# Patient Record
Sex: Male | Born: 1935 | Race: Black or African American | Hispanic: No
Health system: Southern US, Community
[De-identification: ages and names within clinical notes are randomized; demographics above are authoritative.]

---

## 2013-08-11 ENCOUNTER — Inpatient Hospital Stay
Admission: AD | Admit: 2013-08-11 | Discharge: 2013-09-14 | Disposition: A | Discharge status: Skilled Nursing Facility | Payer: Self-pay | Source: Ambulatory Visit | Attending: Internal Medicine | Admitting: Internal Medicine

## 2013-08-12 ENCOUNTER — Other Ambulatory Visit (HOSPITAL_COMMUNITY): Payer: Self-pay

## 2013-08-12 LAB — SEDIMENTATION RATE: SED RATE: 78 mm/h — AB (ref 0–16)

## 2013-08-12 LAB — BASIC METABOLIC PANEL
BUN: 9 mg/dL (ref 6–23)
CHLORIDE: 103 meq/L (ref 96–112)
CO2: 25 mEq/L (ref 19–32)
CREATININE: 1.05 mg/dL (ref 0.50–1.35)
Calcium: 8.6 mg/dL (ref 8.4–10.5)
GFR calc Af Amer: 78 mL/min — ABNORMAL LOW (ref >90)
GFR calc non Af Amer: 67 mL/min — ABNORMAL LOW (ref >90)
Glucose, Bld: 144 mg/dL — ABNORMAL HIGH (ref 70–99)
Potassium: 4.1 mEq/L (ref 3.7–5.3)
SODIUM: 140 meq/L (ref 137–147)

## 2013-08-12 LAB — TSH: TSH: 2.336 u[IU]/mL (ref 0.350–4.500)

## 2013-08-12 LAB — CBC
HCT: 33 % — ABNORMAL LOW (ref 39.0–52.0)
Hemoglobin: 10.4 g/dL — ABNORMAL LOW (ref 13.0–17.0)
MCH: 30.1 pg (ref 26.0–34.0)
MCHC: 31.5 g/dL (ref 30.0–36.0)
MCV: 95.4 fL (ref 78.0–100.0)
PLATELETS: 186 10*3/uL (ref 150–400)
RBC: 3.46 MIL/uL — ABNORMAL LOW (ref 4.22–5.81)
RDW: 16.3 % — AB (ref 11.5–15.5)
WBC: 11.8 10*3/uL — AB (ref 4.0–10.5)

## 2013-08-12 LAB — HEPATIC FUNCTION PANEL
ALBUMIN: 2.5 g/dL — AB (ref 3.5–5.2)
ALK PHOS: 70 U/L (ref 39–117)
ALT: 16 U/L (ref 0–53)
AST: 18 U/L (ref 0–37)
Bilirubin, Direct: <0.2 mg/dL (ref 0.0–0.3)
Total Bilirubin: 0.5 mg/dL (ref 0.3–1.2)
Total Protein: 6.1 g/dL (ref 6.0–8.3)

## 2013-08-12 LAB — PROTIME-INR
INR: 2 — ABNORMAL HIGH (ref 0.00–1.49)
Prothrombin Time: 22.1 seconds — ABNORMAL HIGH (ref 11.6–15.2)

## 2013-08-12 LAB — C-REACTIVE PROTEIN: CRP: 4.7 mg/dL — AB (ref <0.60)

## 2013-08-12 LAB — PROCALCITONIN: Procalcitonin: <0.1 ng/mL

## 2013-08-13 LAB — PROTIME-INR
INR: 2.47 — ABNORMAL HIGH (ref 0.00–1.49)
PROTHROMBIN TIME: 25.9 s — AB (ref 11.6–15.2)

## 2013-08-14 ENCOUNTER — Other Ambulatory Visit (HOSPITAL_COMMUNITY): Payer: Self-pay

## 2013-08-14 LAB — CBC WITH DIFFERENTIAL/PLATELET
BASOS PCT: 1 % (ref 0–1)
Basophils Absolute: 0 10*3/uL (ref 0.0–0.1)
EOS ABS: 0.2 10*3/uL (ref 0.0–0.7)
EOS PCT: 2 % (ref 0–5)
HCT: 34 % — ABNORMAL LOW (ref 39.0–52.0)
Hemoglobin: 10.7 g/dL — ABNORMAL LOW (ref 13.0–17.0)
Lymphocytes Relative: 25 % (ref 12–46)
Lymphs Abs: 2 10*3/uL (ref 0.7–4.0)
MCH: 30.4 pg (ref 26.0–34.0)
MCHC: 31.5 g/dL (ref 30.0–36.0)
MCV: 96.6 fL (ref 78.0–100.0)
MONOS PCT: 14 % — AB (ref 3–12)
Monocytes Absolute: 1.1 10*3/uL — ABNORMAL HIGH (ref 0.1–1.0)
NEUTROS PCT: 58 % (ref 43–77)
Neutro Abs: 4.7 10*3/uL (ref 1.7–7.7)
Platelets: 191 10*3/uL (ref 150–400)
RBC: 3.52 MIL/uL — ABNORMAL LOW (ref 4.22–5.81)
RDW: 16.2 % — ABNORMAL HIGH (ref 11.5–15.5)
WBC: 8 10*3/uL (ref 4.0–10.5)

## 2013-08-14 LAB — BASIC METABOLIC PANEL
BUN: 7 mg/dL (ref 6–23)
CALCIUM: 9.2 mg/dL (ref 8.4–10.5)
CO2: 24 mEq/L (ref 19–32)
CREATININE: 0.97 mg/dL (ref 0.50–1.35)
Chloride: 103 mEq/L (ref 96–112)
GFR calc non Af Amer: 78 mL/min — ABNORMAL LOW (ref >90)
GLUCOSE: 92 mg/dL (ref 70–99)
POTASSIUM: 4.3 meq/L (ref 3.7–5.3)
Sodium: 139 mEq/L (ref 137–147)

## 2013-08-14 LAB — CK TOTAL AND CKMB (NOT AT ARMC)
CK TOTAL: 41 U/L (ref 7–232)
CK, MB: 2.2 ng/mL (ref 0.3–4.0)
Relative Index: INVALID (ref 0.0–2.5)

## 2013-08-14 LAB — PROTIME-INR
INR: 2.89 — ABNORMAL HIGH (ref 0.00–1.49)
PROTHROMBIN TIME: 29.2 s — AB (ref 11.6–15.2)

## 2013-08-14 LAB — TROPONIN I: Troponin I: <0.3 ng/mL (ref <0.30)

## 2013-08-14 LAB — PRO B NATRIURETIC PEPTIDE: Pro B Natriuretic peptide (BNP): 2430 pg/mL — ABNORMAL HIGH (ref 0–450)

## 2013-08-14 NOTE — Consult Note (Signed)
Reason for Consult: Gangrenous ulcer lateral aspect right foot status post fourth and fifth ray amputation. Referring Physician: [name] is an 79 y.o. male.  HPI: Patient is a 79 year old gentleman with severe peripheral vascular disease. He has a left above-the-knee amputation. Patient is status post fourth and fifth ray amputations on the right foot. He is developed progressive dehiscence and necrosis of the wound.  No past medical history on file.  No past surgical history on file.  No family history on file.  Social History:  has no tobacco, alcohol, and drug history on file.  Allergies: Allergies not on file  Medications: I have reviewed the patient's current medications.  Results for orders placed during the hospital encounter of 08/11/13 (from the past 48 hour(s))  PROTIME-INR     Status: Abnormal   Collection Time    08/13/13  7:00 AM      Result Value Ref Range   Prothrombin Time 25.9 (*) 11.6 - 15.2 seconds   INR 2.47 (*) 0.00 - 1.49  CBC WITH DIFFERENTIAL     Status: Abnormal   Collection Time    08/14/13  5:25 AM      Result Value Ref Range   WBC 8.0  4.0 - 10.5 K/uL   RBC 3.52 (*) 4.22 - 5.81 MIL/uL   Hemoglobin 10.7 (*) 13.0 - 17.0 g/dL   HCT 34.0 (*) 39.0 - 52.0 %   MCV 96.6  78.0 - 100.0 fL   MCH 30.4  26.0 - 34.0 pg   MCHC 31.5  30.0 - 36.0 g/dL   RDW 16.2 (*) 11.5 - 15.5 %   Platelets 191  150 - 400 K/uL   Neutrophils Relative % 58  43 - 77 %   Neutro Abs 4.7  1.7 - 7.7 K/uL   Lymphocytes Relative 25  12 - 46 %   Lymphs Abs 2.0  0.7 - 4.0 K/uL   Monocytes Relative 14 (*) 3 - 12 %   Monocytes Absolute 1.1 (*) 0.1 - 1.0 K/uL   Eosinophils Relative 2  0 - 5 %   Eosinophils Absolute 0.2  0.0 - 0.7 K/uL   Basophils Relative 1  0 - 1 %   Basophils Absolute 0.0  0.0 - 0.1 K/uL  BASIC METABOLIC PANEL     Status: Abnormal   Collection Time    08/14/13  5:25 AM      Result Value Ref Range   Sodium 139  137 - 147 mEq/L   Potassium 4.3   3.7 - 5.3 mEq/L   Chloride 103  96 - 112 mEq/L   CO2 24  19 - 32 mEq/L   Glucose, Bld 92  70 - 99 mg/dL   BUN 7  6 - 23 mg/dL   Creatinine, Ser 0.97  0.50 - 1.35 mg/dL   Calcium 9.2  8.4 - 10.5 mg/dL   GFR calc non Af Amer 78 (*) >90 mL/min   GFR calc Af Amer >90  >90 mL/min   Comment: (NOTE)     The eGFR has been calculated using the CKD EPI equation.     This calculation has not been validated in all clinical situations.     eGFR's persistently <90 mL/min signify possible Chronic Kidney     Disease.  PRO B NATRIURETIC PEPTIDE     Status: Abnormal   Collection Time    08/14/13  5:25 AM      Result Value Ref Range   Pro  B Natriuretic peptide (BNP) 2430.0 (*) 0 - 450 pg/mL  TROPONIN I     Status: None   Collection Time    08/14/13  5:25 AM      Result Value Ref Range   Troponin I <0.30  <0.30 ng/mL   Comment:            Due to the release kinetics of cTnI,     a negative result within the first hours     of the onset of symptoms does not rule out     myocardial infarction with certainty.     If myocardial infarction is still suspected,     repeat the test at appropriate intervals.  CK TOTAL AND CKMB     Status: None   Collection Time    08/14/13  5:25 AM      Result Value Ref Range   Total CK 41  7 - 232 U/L   CK, MB 2.2  0.3 - 4.0 ng/mL   Relative Index RELATIVE INDEX IS INVALID  0.0 - 2.5   Comment: WHEN CK < 100 U/L             PROTIME-INR     Status: Abnormal   Collection Time    08/14/13  5:25 AM      Result Value Ref Range   Prothrombin Time 29.2 (*) 11.6 - 15.2 seconds   INR 2.89 (*) 0.00 - 1.49    Dg Foot Complete Right  08/14/2013   CLINICAL DATA:  Transmetatarsal amputation of the fourth and fifth toes  EXAM: RIGHT FOOT COMPLETE - 3+ VIEW  COMPARISON:  No preoperative imaging  FINDINGS: Evidence of transmetatarsal amputation at the distal fourth and fifth toes is noted. Degenerative change noted at the first metatarsal-phalangeal joint. No radiopaque foreign  body. No other soft tissue abnormality except for soft tissue defect.  IMPRESSION: Postsurgical changes as above status post transmetatarsal amputation of the right fourth and fifth metatarsals.   Electronically Signed   By: Conchita Paris M.D.   On: 08/14/2013 17:00    Review of Systems  All other systems reviewed and are negative.   There were no vitals taken for this visit. Physical Exam On examination patient has thin atrophic skin to the right lower extremity. He is extremely tender to light touch throughout the lower extremity. Patient has a necrotic wound over the fourth and fifth ray amputation sites which is 2 cm x 3 cm x 1 cm deep. There is 50% granulation tissue but the wound is ischemic. There is no exposed bone. Assessment/Plan: Assessment: Ischemic ulcer lateral aspect right foot status post fourth and fifth ray amputations.  Plan: Radiographs and ankle-brachial indices have been ordered. I anticipate ankle-brachial indices should show insufficient circulation for healing. Patient would require a vascular surgery consult to see if he has a revascularization candidate. If patient is not a revascularization candidate a above-the-knee amputation is his best option.  Daniel,Steven V 08/14/2013, 6:39 PM

## 2013-08-15 DIAGNOSIS — I70269 Atherosclerosis of native arteries of extremities with gangrene, unspecified extremity: Secondary | ICD-10-CM

## 2013-08-15 LAB — PROTIME-INR
INR: 2.78 — AB (ref 0.00–1.49)
PROTHROMBIN TIME: 28.4 s — AB (ref 11.6–15.2)

## 2013-08-15 LAB — VANCOMYCIN, TROUGH: VANCOMYCIN TR: 13.3 ug/mL (ref 10.0–20.0)

## 2013-08-15 NOTE — Consult Note (Signed)
[facility] NOTE   MRN : [id number]  Reason for Consult: Gangrenous ulcer lateral aspect right foot status post fourth and fifth ray amputation.  Referring Physician: Merril Abbe  History of Present Illness: 79 y/o male with gangrenous ulcer lateral aspect right foot status post fourth and fifth ray amputation.  H had a left AKA 10 years ago secondary to "blood clot."  His medical history includes COPD, HTN, history of DVT on Coumadin, Asprin 81 mg, and Carvedilol with Amiodarone.  They are currently treating him with Vancomycin for his wound.  The history states he has known PAD.  He had an arteriogram with popliteal stent in Feb. 2015 in [facility] by Dr Sondra Come.          Pt meds include: Statin :No Betablocker: Yes ASA: Yes Other anticoagulants/antiplatelets: Coumadin   Past medical history includes: Cardiomyopathy DVT COPD Home O2 DM  Social History History  Substance Use Topics  . Smoking status: Not on file  . Smokeless tobacco: Not on file  . Alcohol Use: Not on file    Family History The patient states he does not know his family history   NKDA  REVIEW OF SYSTEMS  General: [ ]  Weight loss, [ ]  Fever, [ ]  chills Neurologic: [ ]  Dizziness, [ ]  Blackouts, [ ]  Seizure [ ]  Stroke, [ ]  "Mini stroke", [ ]  Slurred speech, [ ]  Temporary blindness; [ ]  weakness in arms or legs, [ ]  Hoarseness [ ]  Dysphagia Cardiac: [ ]  Chest pain/pressure, [ ]  Shortness of breath at rest [ ]  Shortness of breath with exertion, [ ]  Atrial fibrillation or irregular heartbeat  Vascular: [x ] Pain in legs with walking, [ ]  Pain in legs at rest, [ ]  Pain in legs at night,  [ ]  Non-healing ulcer, [ ]  Blood clot in vein/DVT,   Pulmonary: [x ] Home oxygen, [ ]  Productive cough, [ ]  Coughing up blood, [ ]  Asthma,  [ ]  Wheezing [x ] COPD Musculoskeletal:  [ ]  Arthritis, [ ]  Low back pain, [ ]  Joint pain Hematologic: [ ]  Easy Bruising, [ ]  Anemia; [ ]   Hepatitis Gastrointestinal: [ ]  Blood in stool, [ ]  Gastroesophageal Reflux/heartburn, Urinary: [ ]  chronic Kidney disease, [ ]  on HD - [ ]  MWF or [ ]  TTHS, [ ]  Burning with urination, [ ]  Difficulty urinating Skin: [ ]  Rashes, [x ] Wounds Psychological: [ ]  Anxiety, [ ]  Depression  Physical Examination  General:  WDWN in NAD HENT: WNL Eyes: Pupils equal Pulmonary: normal non-labored breathing Cardiac: RRR, without  Murmurs, rubs or gallops; Abdomen: soft, NT, no masses Skin: no rashes, ulcers noted;  positive Gangrene , no cellulitis; positive open wound right lateral foot post 4th and 5th digit amputation. No drainage.  Well healed AKA.  Vascular Exam/Pulses:weak palpable femoral pulses, right LE non palpable pulses.  Doppler signal anterior tibialis only.   Musculoskeletal: no muscle wasting or atrophy; no edema  Neurologic: A&O X 3; Appropriate Affect ;  SENSATION: normal; MOTOR FUNCTION: 5/5 Symmetric Speech is fluent/normal   Significant Diagnostic Studies: CBC Lab Results  Component Value Date   WBC 8.0 08/14/2013   HGB 10.7* 08/14/2013   HCT 34.0* 08/14/2013   MCV 96.6 08/14/2013   PLT 191 08/14/2013    BMET    Component Value Date/Time   NA 139 08/14/2013 0525   K 4.3 08/14/2013 0525   CL 103 08/14/2013 0525   CO2 24 08/14/2013 0525  GLUCOSE 92 08/14/2013 0525   BUN 7 08/14/2013 0525   CREATININE 0.97 08/14/2013 0525   CALCIUM 9.2 08/14/2013 0525   GFRNONAA 78* 08/14/2013 0525   GFRAA >90 08/14/2013 0525   CrCl is unknown because there is no height on file for the current visit.  COAG Lab Results  Component Value Date   INR 2.78* 08/15/2013   INR 2.89* 08/14/2013   INR 2.47* 08/13/2013     Non-Invasive Vascular Imaging: Pending ABI,   ASSESSMENT/PLAN:  PAD Right lateral non healing wound s/p 4th and 5th digit amputations. We will need a copy of the arterial study and or OP note from stent. He may need an angiogram for further workup. I will discuss this  with Dr. Ivar DrapeFields   COLLINS, EMMA [facility] 08/15/2013 3:40 PM  History and exam details as above.  Pt is non ambulatory and has not walked for several months.  He has either some dementia or encephalopathy.  Most likely his popliteal stent is occluded based on absent popliteal pulse and the poorly healing wound on his foot. He does have a femoral pulse.  He is not a candidate for open bypass procedure and in his current state of health I do not believe he is a candidate for repeat arteriogram or intervention.  I discussed amputation of the right leg of the patient and he is amenable to this.  No vascular intervention planned at this point.  Agree with Dr Lajoyce Cornersuda that right AKA is probably best plan for infection contral and wound healing.  Fabienne Brunsharles Fields, MD [facility] [place] Office: [phone] Pager: [phone]

## 2013-08-16 LAB — CBC WITH DIFFERENTIAL/PLATELET
BASOS ABS: 0 10*3/uL (ref 0.0–0.1)
BASOS PCT: 0 % (ref 0–1)
Eosinophils Absolute: 0.2 10*3/uL (ref 0.0–0.7)
Eosinophils Relative: 2 % (ref 0–5)
HEMATOCRIT: 34.8 % — AB (ref 39.0–52.0)
Hemoglobin: 11.1 g/dL — ABNORMAL LOW (ref 13.0–17.0)
LYMPHS PCT: 20 % (ref 12–46)
Lymphs Abs: 1.9 10*3/uL (ref 0.7–4.0)
MCH: 30.6 pg (ref 26.0–34.0)
MCHC: 31.9 g/dL (ref 30.0–36.0)
MCV: 95.9 fL (ref 78.0–100.0)
Monocytes Absolute: 1.7 10*3/uL — ABNORMAL HIGH (ref 0.1–1.0)
Monocytes Relative: 18 % — ABNORMAL HIGH (ref 3–12)
NEUTROS ABS: 5.8 10*3/uL (ref 1.7–7.7)
Neutrophils Relative %: 61 % (ref 43–77)
PLATELETS: 156 10*3/uL (ref 150–400)
RBC: 3.63 MIL/uL — ABNORMAL LOW (ref 4.22–5.81)
RDW: 15.8 % — ABNORMAL HIGH (ref 11.5–15.5)
WBC: 9.5 10*3/uL (ref 4.0–10.5)

## 2013-08-16 LAB — PROTIME-INR
INR: 2.53 — AB (ref 0.00–1.49)
PROTHROMBIN TIME: 26.4 s — AB (ref 11.6–15.2)

## 2013-08-16 LAB — BASIC METABOLIC PANEL
BUN: 11 mg/dL (ref 6–23)
CALCIUM: 9.2 mg/dL (ref 8.4–10.5)
CHLORIDE: 102 meq/L (ref 96–112)
CO2: 25 meq/L (ref 19–32)
Creatinine, Ser: 1.03 mg/dL (ref 0.50–1.35)
GFR calc Af Amer: 79 mL/min — ABNORMAL LOW (ref >90)
GFR calc non Af Amer: 68 mL/min — ABNORMAL LOW (ref >90)
Glucose, Bld: 114 mg/dL — ABNORMAL HIGH (ref 70–99)
Potassium: 4.5 mEq/L (ref 3.7–5.3)
Sodium: 140 mEq/L (ref 137–147)

## 2013-08-17 ENCOUNTER — Other Ambulatory Visit (HOSPITAL_COMMUNITY): Payer: Self-pay | Admitting: Orthopedic Surgery

## 2013-08-17 LAB — VANCOMYCIN, TROUGH: Vancomycin Tr: 11.7 ug/mL (ref 10.0–20.0)

## 2013-08-17 LAB — PROTIME-INR
INR: 2.64 — ABNORMAL HIGH (ref 0.00–1.49)
PROTHROMBIN TIME: 27.3 s — AB (ref 11.6–15.2)

## 2013-08-18 ENCOUNTER — Encounter (HOSPITAL_COMMUNITY): Payer: Self-pay | Admitting: Anesthesiology

## 2013-08-18 ENCOUNTER — Encounter
Admission: AD | Disposition: A | Discharge status: Skilled Nursing Facility | Payer: Self-pay | Source: Ambulatory Visit | Attending: Internal Medicine

## 2013-08-18 HISTORY — PX: AMPUTATION: SHX166

## 2013-08-18 LAB — GLUCOSE, CAPILLARY
Glucose-Capillary: 87 mg/dL (ref 70–99)
Glucose-Capillary: 96 mg/dL (ref 70–99)

## 2013-08-18 LAB — BASIC METABOLIC PANEL
BUN: 14 mg/dL (ref 6–23)
CALCIUM: 9.6 mg/dL (ref 8.4–10.5)
CO2: 25 mEq/L (ref 19–32)
Chloride: 101 mEq/L (ref 96–112)
Creatinine, Ser: 1.07 mg/dL (ref 0.50–1.35)
GFR calc Af Amer: 76 mL/min — ABNORMAL LOW (ref >90)
GFR calc non Af Amer: 65 mL/min — ABNORMAL LOW (ref >90)
GLUCOSE: 91 mg/dL (ref 70–99)
Potassium: 4.6 mEq/L (ref 3.7–5.3)
Sodium: 139 mEq/L (ref 137–147)

## 2013-08-18 LAB — CBC
HEMATOCRIT: 35.8 % — AB (ref 39.0–52.0)
HEMOGLOBIN: 11.2 g/dL — AB (ref 13.0–17.0)
MCH: 30.2 pg (ref 26.0–34.0)
MCHC: 31.3 g/dL (ref 30.0–36.0)
MCV: 96.5 fL (ref 78.0–100.0)
Platelets: 156 10*3/uL (ref 150–400)
RBC: 3.71 MIL/uL — ABNORMAL LOW (ref 4.22–5.81)
RDW: 15.6 % — ABNORMAL HIGH (ref 11.5–15.5)
WBC: 8.4 10*3/uL (ref 4.0–10.5)

## 2013-08-18 LAB — PROTIME-INR
INR: 2.14 — ABNORMAL HIGH (ref 0.00–1.49)
Prothrombin Time: 23.2 seconds — ABNORMAL HIGH (ref 11.6–15.2)

## 2013-08-18 SURGERY — AMPUTATION, ABOVE KNEE
Anesthesia: General | Site: Leg Lower | Laterality: Right

## 2013-08-18 MED ORDER — PROPOFOL 10 MG/ML IV BOLUS
INTRAVENOUS | Status: AC
  Filled 2013-08-18: qty 20

## 2013-08-18 MED ORDER — 0.9 % SODIUM CHLORIDE (POUR BTL) OPTIME
TOPICAL | Status: DC | PRN
  Administered 2013-08-18: 1000 mL

## 2013-08-18 MED ORDER — FENTANYL CITRATE 0.05 MG/ML IJ SOLN
INTRAMUSCULAR | Status: DC | PRN
  Administered 2013-08-18 (×3): 50 ug via INTRAVENOUS

## 2013-08-18 MED ORDER — LACTATED RINGERS IV SOLN
INTRAVENOUS | Status: DC | PRN
  Administered 2013-08-18: 13:00:00 via INTRAVENOUS

## 2013-08-18 MED ORDER — FENTANYL CITRATE 0.05 MG/ML IJ SOLN
INTRAMUSCULAR | Status: AC
  Filled 2013-08-18: qty 5

## 2013-08-18 MED ORDER — LIDOCAINE HCL (CARDIAC) 20 MG/ML IV SOLN
INTRAVENOUS | Status: DC | PRN
  Administered 2013-08-18: 80 mg via INTRAVENOUS

## 2013-08-18 MED ORDER — OXYCODONE HCL 5 MG PO TABS: 5.0000 mg | ORAL_TABLET | Freq: Once | ORAL | Status: DC | PRN

## 2013-08-18 MED ORDER — LIDOCAINE HCL (CARDIAC) 20 MG/ML IV SOLN
INTRAVENOUS | Status: AC
  Filled 2013-08-18: qty 5

## 2013-08-18 MED ORDER — OXYCODONE HCL 5 MG/5ML PO SOLN: 5.0000 mg | Freq: Once | ORAL | Status: DC | PRN

## 2013-08-18 MED ORDER — ONDANSETRON HCL 4 MG/2ML IJ SOLN
INTRAMUSCULAR | Status: DC | PRN
  Administered 2013-08-18: 4 mg via INTRAVENOUS

## 2013-08-18 MED ORDER — HYDROMORPHONE HCL PF 1 MG/ML IJ SOLN
INTRAMUSCULAR | Status: AC
  Filled 2013-08-18: qty 1

## 2013-08-18 MED ORDER — CEFAZOLIN SODIUM-DEXTROSE 2-3 GM-% IV SOLR
INTRAVENOUS | Status: DC | PRN
  Administered 2013-08-18: 2 g via INTRAVENOUS

## 2013-08-18 MED ORDER — LACTATED RINGERS IV SOLN
INTRAVENOUS | Status: DC
  Administered 2013-08-18: 13:00:00 via INTRAVENOUS

## 2013-08-18 MED ORDER — MIDAZOLAM HCL 2 MG/2ML IJ SOLN
INTRAMUSCULAR | Status: AC
  Filled 2013-08-18: qty 2

## 2013-08-18 MED ORDER — PROPOFOL 10 MG/ML IV BOLUS
INTRAVENOUS | Status: DC | PRN
  Administered 2013-08-18: 160 mg via INTRAVENOUS

## 2013-08-18 MED ORDER — HYDROMORPHONE HCL PF 1 MG/ML IJ SOLN
0.2500 mg | INTRAMUSCULAR | Status: DC | PRN
  Administered 2013-08-18: 0.25 mg via INTRAVENOUS

## 2013-08-18 MED ORDER — METOCLOPRAMIDE HCL 5 MG/ML IJ SOLN: 10.0000 mg | Freq: Once | INTRAMUSCULAR | Status: DC | PRN

## 2013-08-18 SURGICAL SUPPLY — 40 items
BANDAGE GAUZE ELAST BULKY 4 IN (GAUZE/BANDAGES/DRESSINGS) ×3 IMPLANT
BLADE SAW RECIP 87.9 MT (BLADE) ×3 IMPLANT
BNDG COHESIVE 6X5 TAN STRL LF (GAUZE/BANDAGES/DRESSINGS) ×3 IMPLANT
COVER SURGICAL LIGHT HANDLE (MISCELLANEOUS) ×3 IMPLANT
CUFF TOURNIQUET SINGLE 34IN LL (TOURNIQUET CUFF) IMPLANT
CUFF TOURNIQUET SINGLE 44IN (TOURNIQUET CUFF) IMPLANT
DRAIN PENROSE 1/2X12 LTX STRL (WOUND CARE) IMPLANT
DRAPE EXTREMITY T 121X128X90 (DRAPE) ×3 IMPLANT
DRAPE PROXIMA HALF (DRAPES) ×3 IMPLANT
DRAPE U-SHAPE 47X51 STRL (DRAPES) ×6 IMPLANT
DRSG ADAPTIC 3X8 NADH LF (GAUZE/BANDAGES/DRESSINGS) ×3 IMPLANT
DURAPREP 26ML APPLICATOR (WOUND CARE) ×3 IMPLANT
ELECT CAUTERY BLADE 6.4 (BLADE) IMPLANT
ELECT REM PT RETURN 9FT ADLT (ELECTROSURGICAL) ×3
ELECTRODE REM PT RTRN 9FT ADLT (ELECTROSURGICAL) ×1 IMPLANT
EVACUATOR 1/8 PVC DRAIN (DRAIN) IMPLANT
GLOVE BIOGEL PI IND STRL 9 (GLOVE) ×1 IMPLANT
GLOVE BIOGEL PI INDICATOR 9 (GLOVE) ×2
GLOVE SURG ORTHO 9.0 STRL STRW (GLOVE) ×3 IMPLANT
GOWN STRL REUS W/ TWL XL LVL3 (GOWN DISPOSABLE) ×2 IMPLANT
GOWN STRL REUS W/TWL XL LVL3 (GOWN DISPOSABLE) ×4
KIT BASIN OR (CUSTOM PROCEDURE TRAY) ×3 IMPLANT
KIT ROOM TURNOVER OR (KITS) ×3 IMPLANT
MANIFOLD NEPTUNE II (INSTRUMENTS) ×3 IMPLANT
NS IRRIG 1000ML POUR BTL (IV SOLUTION) ×3 IMPLANT
PACK GENERAL/GYN (CUSTOM PROCEDURE TRAY) ×3 IMPLANT
PAD ABD 8X10 STRL (GAUZE/BANDAGES/DRESSINGS) ×6 IMPLANT
PAD ARMBOARD 7.5X6 YLW CONV (MISCELLANEOUS) ×3 IMPLANT
SPONGE GAUZE 4X4 12PLY (GAUZE/BANDAGES/DRESSINGS) ×3 IMPLANT
STAPLER VISISTAT 35W (STAPLE) IMPLANT
STOCKINETTE IMPERVIOUS LG (DRAPES) IMPLANT
SUT PDS AB 1 CT  36 (SUTURE)
SUT PDS AB 1 CT 36 (SUTURE) IMPLANT
SUT SILK 2 0 (SUTURE) ×2
SUT SILK 2-0 18XBRD TIE 12 (SUTURE) ×1 IMPLANT
SWAB COLLECTION DEVICE MRSA (MISCELLANEOUS) IMPLANT
TOWEL OR 17X24 6PK STRL BLUE (TOWEL DISPOSABLE) ×3 IMPLANT
TOWEL OR 17X26 10 PK STRL BLUE (TOWEL DISPOSABLE) ×3 IMPLANT
TUBE ANAEROBIC SPECIMEN COL (MISCELLANEOUS) IMPLANT
WATER STERILE IRR 1000ML POUR (IV SOLUTION) ×3 IMPLANT

## 2013-08-18 NOTE — Interval H&P Note (Signed)
History and Physical Interval Note:  08/18/2013 6:08 AM  [name]  has presented today for surgery, with the diagnosis of Gangrene Right Foot  The various methods of treatment have been discussed with the patient and family. After consideration of risks, benefits and other options for treatment, the patient has consented to  Procedure(s) with comments: AMPUTATION ABOVE KNEE (Right) - Right Above Knee Amputation as a surgical intervention .  The patient's history has been reviewed, patient examined, no change in status, stable for surgery.  I have reviewed the patient's chart and labs.  Questions were answered to the patient's satisfaction.     Hilmar Moldovan V

## 2013-08-18 NOTE — Anesthesia Preprocedure Evaluation (Signed)
Anesthesia Evaluation  Patient identified by MRN, date of birth, ID band Patient awake    Reviewed: Allergy & Precautions, H&P , NPO status , Patient's Chart, lab work & pertinent test results, reviewed documented beta blocker date and time   Airway Mallampati: II TM Distance: >3 FB Neck ROM: full    Dental   Pulmonary neg pulmonary ROS,  breath sounds clear to auscultation        Cardiovascular hypertension, On Medications + Peripheral Vascular Disease and +CHF Rhythm:regular     Neuro/Psych negative neurological ROS  negative psych ROS   GI/Hepatic negative GI ROS, Neg liver ROS,   Endo/Other  diabetes  Renal/GU negative Renal ROS  negative genitourinary   Musculoskeletal   Abdominal   Peds  Hematology negative hematology ROS (+)   Anesthesia Other Findings See surgeon's H&P   Reproductive/Obstetrics negative OB ROS                           Anesthesia Physical Anesthesia Plan  ASA: III  Anesthesia Plan: General   Post-op Pain Management:    Induction: Intravenous  Airway Management Planned: LMA  Additional Equipment:   Intra-op Plan:   Post-operative Plan:   Informed Consent: I have reviewed the patients History and Physical, chart, labs and discussed the procedure including the risks, benefits and alternatives for the proposed anesthesia with the patient or authorized representative who has indicated his/her understanding and acceptance.   Dental Advisory Given  Plan Discussed with: CRNA and Surgeon  Anesthesia Plan Comments:         Anesthesia Quick Evaluation

## 2013-08-18 NOTE — Preoperative (Signed)
Beta Blockers   Reason not to administer Beta Blockers:Not Applicable 

## 2013-08-18 NOTE — Op Note (Signed)
OPERATIVE REPORT  DATE OF SURGERY: 08/18/2013  PATIENT:  Steven BreadJohn Lobosco,  79 y.o. male  PRE-OPERATIVE DIAGNOSIS:  Gangrene Right Foot  POST-OPERATIVE DIAGNOSIS:  Gangrene Right Foot  PROCEDURE:  Procedure(s): AMPUTATION ABOVE KNEE  SURGEON:  Surgeon(s): Nadara MustardMarcus V [name], MD  ANESTHESIA:   general  EBL:  Minimal ML  SPECIMEN:  Source of Specimen:  Right leg  TOURNIQUET:  * No tourniquets in log *  PROCEDURE DETAILS: Patient is a 79 year old gentleman with severe peripheral vascular disease. He is status post revascularization to the right lower extremity consult with vascular specialist show there was no circulation distal to the popliteal fossa. Patient is not revascularization candidate and presents at this time for above-the-knee amputation. Risks and benefits were discussed including nonhealing of the wound and need for higher level amputation. Patient states he understands and wished to proceed at this time. Description of procedure patient was brought to the operating room underwent a general anesthetic. After adequate levels of anesthesia were obtained patient's right lower extremity was prepped using DuraPrep and draped into a sterile field. A fishmouth incision was made just proximal to the patella. The femur was transected just proximal to the skin incision. The wound is irrigated with normal saline the deep and superficial fascial layers were closed using #1 PDS. The skin was closed using staples. The wound is covered Adaptic orthopedic sponges AB dressing Kerlix and Coban. Patient was extubated taken to the PACU in stable condition [facility]  [facility] CARE[facility]  PATIENT DISPOSITION:  PACU - hemodynamically stable.   Nadara MustardUDA,MARCUS V, MD 08/18/2013 4:30 PM

## 2013-08-18 NOTE — H&P (View-Only) (Signed)
Reason for Consult: Gangrenous ulcer lateral aspect right foot status post fourth and fifth ray amputation. Referring Physician: Dr [name] is an 79 y.o. male.  HPI: Patient is a 79 year old gentleman with severe peripheral vascular disease. He has a left above-the-knee amputation. Patient is status post fourth and fifth ray amputations on the right foot. He is developed progressive dehiscence and necrosis of the wound.  No past medical history on file.  No past surgical history on file.  No family history on file.  Social History:  has no tobacco, alcohol, and drug history on file.  Allergies: Allergies not on file  Medications: I have reviewed the patient's current medications.  Results for orders placed during the hospital encounter of 08/11/13 (from the past 48 hour(s))  PROTIME-INR     Status: Abnormal   Collection Time    08/13/13  7:00 AM      Result Value Ref Range   Prothrombin Time 25.9 (*) 11.6 - 15.2 seconds   INR 2.47 (*) 0.00 - 1.49  CBC WITH DIFFERENTIAL     Status: Abnormal   Collection Time    08/14/13  5:25 AM      Result Value Ref Range   WBC 8.0  4.0 - 10.5 K/uL   RBC 3.52 (*) 4.22 - 5.81 MIL/uL   Hemoglobin 10.7 (*) 13.0 - 17.0 g/dL   HCT 34.0 (*) 39.0 - 52.0 %   MCV 96.6  78.0 - 100.0 fL   MCH 30.4  26.0 - 34.0 pg   MCHC 31.5  30.0 - 36.0 g/dL   RDW 16.2 (*) 11.5 - 15.5 %   Platelets 191  150 - 400 K/uL   Neutrophils Relative % 58  43 - 77 %   Neutro Abs 4.7  1.7 - 7.7 K/uL   Lymphocytes Relative 25  12 - 46 %   Lymphs Abs 2.0  0.7 - 4.0 K/uL   Monocytes Relative 14 (*) 3 - 12 %   Monocytes Absolute 1.1 (*) 0.1 - 1.0 K/uL   Eosinophils Relative 2  0 - 5 %   Eosinophils Absolute 0.2  0.0 - 0.7 K/uL   Basophils Relative 1  0 - 1 %   Basophils Absolute 0.0  0.0 - 0.1 K/uL  BASIC METABOLIC PANEL     Status: Abnormal   Collection Time    08/14/13  5:25 AM      Result Value Ref Range   Sodium 139  137 - 147 mEq/L   Potassium 4.3   3.7 - 5.3 mEq/L   Chloride 103  96 - 112 mEq/L   CO2 24  19 - 32 mEq/L   Glucose, Bld 92  70 - 99 mg/dL   BUN 7  6 - 23 mg/dL   Creatinine, Ser 0.97  0.50 - 1.35 mg/dL   Calcium 9.2  8.4 - 10.5 mg/dL   GFR calc non Af Amer 78 (*) >90 mL/min   GFR calc Af Amer >90  >90 mL/min   Comment: (NOTE)     The eGFR has been calculated using the CKD EPI equation.     This calculation has not been validated in all clinical situations.     eGFR's persistently <90 mL/min signify possible Chronic Kidney     Disease.  PRO B NATRIURETIC PEPTIDE     Status: Abnormal   Collection Time    08/14/13  5:25 AM      Result Value Ref Range   Pro  B Natriuretic peptide (BNP) 2430.0 (*) 0 - 450 pg/mL  TROPONIN I     Status: None   Collection Time    08/14/13  5:25 AM      Result Value Ref Range   Troponin I <0.30  <0.30 ng/mL   Comment:            Due to the release kinetics of cTnI,     a negative result within the first hours     of the onset of symptoms does not rule out     myocardial infarction with certainty.     If myocardial infarction is still suspected,     repeat the test at appropriate intervals.  CK TOTAL AND CKMB     Status: None   Collection Time    08/14/13  5:25 AM      Result Value Ref Range   Total CK 41  7 - 232 U/L   CK, MB 2.2  0.3 - 4.0 ng/mL   Relative Index RELATIVE INDEX IS INVALID  0.0 - 2.5   Comment: WHEN CK < 100 U/L             PROTIME-INR     Status: Abnormal   Collection Time    08/14/13  5:25 AM      Result Value Ref Range   Prothrombin Time 29.2 (*) 11.6 - 15.2 seconds   INR 2.89 (*) 0.00 - 1.49    Dg Foot Complete Right  08/14/2013   CLINICAL DATA:  Transmetatarsal amputation of the fourth and fifth toes  EXAM: RIGHT FOOT COMPLETE - 3+ VIEW  COMPARISON:  No preoperative imaging  FINDINGS: Evidence of transmetatarsal amputation at the distal fourth and fifth toes is noted. Degenerative change noted at the first metatarsal-phalangeal joint. No radiopaque foreign  body. No other soft tissue abnormality except for soft tissue defect.  IMPRESSION: Postsurgical changes as above status post transmetatarsal amputation of the right fourth and fifth metatarsals.   Electronically Signed   By: Conchita Paris M.D.   On: 08/14/2013 17:00    Review of Systems  All other systems reviewed and are negative.   There were no vitals taken for this visit. Physical Exam On examination patient has thin atrophic skin to the right lower extremity. He is extremely tender to light touch throughout the lower extremity. Patient has a necrotic wound over the fourth and fifth ray amputation sites which is 2 cm x 3 cm x 1 cm deep. There is 50% granulation tissue but the wound is ischemic. There is no exposed bone. Assessment/Plan: Assessment: Ischemic ulcer lateral aspect right foot status post fourth and fifth ray amputations.  Plan: Radiographs and ankle-brachial indices have been ordered. I anticipate ankle-brachial indices should show insufficient circulation for healing. Patient would require a vascular surgery consult to see if he has a revascularization candidate. If patient is not a revascularization candidate a above-the-knee amputation is his best option.  [name],[name] 08/14/2013, 6:39 PM

## 2013-08-18 NOTE — Transfer of Care (Signed)
Immediate Anesthesia Transfer of Care Note  Patient: Steven BreadJohn Daniel  Procedure(s) Performed: Procedure(s) with comments: AMPUTATION ABOVE KNEE (Right) - Right Above Knee Amputation  Patient Location: PACU  Anesthesia Type:General  Level of Consciousness: alert , oriented and patient cooperative  Airway & Oxygen Therapy: Patient Spontanous Breathing and Patient connected to face mask oxygen  Post-op Assessment: Report given to PACU RN and Post -op Vital signs reviewed and stable  Post vital signs: Reviewed and stable  Complications: No apparent anesthesia complications

## 2013-08-18 NOTE — Anesthesia Postprocedure Evaluation (Signed)
  Anesthesia Post-op Note  Patient: Steven BreadJohn Daniel  Procedure(s) Performed: Procedure(s) with comments: AMPUTATION ABOVE KNEE (Right) - Right Above Knee Amputation  Patient Location: PACU  Anesthesia Type:General  Level of Consciousness: awake, alert  and oriented  Airway and Oxygen Therapy: Patient Spontanous Breathing  Post-op Pain: mild  Post-op Assessment: Post-op Vital signs reviewed, Patient's Cardiovascular Status Stable, Respiratory Function Stable, Patent Airway, No signs of Nausea or vomiting and Pain level controlled  Post-op Vital Signs: Reviewed and stable  Complications: No apparent anesthesia complications

## 2013-08-19 LAB — CBC WITH DIFFERENTIAL/PLATELET
Basophils Absolute: 0 10*3/uL (ref 0.0–0.1)
Basophils Relative: 0 % (ref 0–1)
Eosinophils Absolute: 0 10*3/uL (ref 0.0–0.7)
Eosinophils Relative: 0 % (ref 0–5)
HEMATOCRIT: 34.2 % — AB (ref 39.0–52.0)
Hemoglobin: 11.4 g/dL — ABNORMAL LOW (ref 13.0–17.0)
LYMPHS PCT: 16 % (ref 12–46)
Lymphs Abs: 2.2 10*3/uL (ref 0.7–4.0)
MCH: 31.5 pg (ref 26.0–34.0)
MCHC: 33.3 g/dL (ref 30.0–36.0)
MCV: 94.5 fL (ref 78.0–100.0)
MONO ABS: 2.5 10*3/uL — AB (ref 0.1–1.0)
Monocytes Relative: 18 % — ABNORMAL HIGH (ref 3–12)
Neutro Abs: 9.3 10*3/uL — ABNORMAL HIGH (ref 1.7–7.7)
Neutrophils Relative %: 66 % (ref 43–77)
Platelets: 203 10*3/uL (ref 150–400)
RBC: 3.62 MIL/uL — AB (ref 4.22–5.81)
RDW: 15.1 % (ref 11.5–15.5)
WBC: 14 10*3/uL — AB (ref 4.0–10.5)

## 2013-08-19 LAB — COMPREHENSIVE METABOLIC PANEL
ALT: 12 U/L (ref 0–53)
AST: 28 U/L (ref 0–37)
Albumin: 2.8 g/dL — ABNORMAL LOW (ref 3.5–5.2)
Alkaline Phosphatase: 74 U/L (ref 39–117)
BUN: 14 mg/dL (ref 6–23)
CO2: 25 meq/L (ref 19–32)
CREATININE: 1.04 mg/dL (ref 0.50–1.35)
Calcium: 9.3 mg/dL (ref 8.4–10.5)
Chloride: 96 mEq/L (ref 96–112)
GFR, EST AFRICAN AMERICAN: 78 mL/min — AB (ref >90)
GFR, EST NON AFRICAN AMERICAN: 68 mL/min — AB (ref >90)
Glucose, Bld: 129 mg/dL — ABNORMAL HIGH (ref 70–99)
Potassium: 4.7 mEq/L (ref 3.7–5.3)
Sodium: 135 mEq/L — ABNORMAL LOW (ref 137–147)
Total Bilirubin: 1.4 mg/dL — ABNORMAL HIGH (ref 0.3–1.2)
Total Protein: 7.4 g/dL (ref 6.0–8.3)

## 2013-08-20 LAB — BLOOD GAS, ARTERIAL
Acid-Base Excess: 2.9 mmol/L — ABNORMAL HIGH (ref 0.0–2.0)
BICARBONATE: 26.6 meq/L — AB (ref 20.0–24.0)
FIO2: 0.21 %
O2 SAT: 97.6 %
PATIENT TEMPERATURE: 98.6
TCO2: 27.8 mmol/L (ref 0–100)
pCO2 arterial: 39 mmHg (ref 35.0–45.0)
pH, Arterial: 7.449 (ref 7.350–7.450)
pO2, Arterial: 90.3 mmHg (ref 80.0–100.0)

## 2013-08-20 LAB — URINALYSIS, ROUTINE W REFLEX MICROSCOPIC
Bilirubin Urine: NEGATIVE
GLUCOSE, UA: NEGATIVE mg/dL
Hgb urine dipstick: NEGATIVE
Ketones, ur: NEGATIVE mg/dL
NITRITE: NEGATIVE
PROTEIN: NEGATIVE mg/dL
Specific Gravity, Urine: 1.024 (ref 1.005–1.030)
UROBILINOGEN UA: 0.2 mg/dL (ref 0.0–1.0)
pH: 5 (ref 5.0–8.0)

## 2013-08-20 LAB — URINE MICROSCOPIC-ADD ON

## 2013-08-20 LAB — PROTIME-INR
INR: 1.69 — AB (ref 0.00–1.49)
Prothrombin Time: 19.4 seconds — ABNORMAL HIGH (ref 11.6–15.2)

## 2013-08-21 ENCOUNTER — Other Ambulatory Visit (HOSPITAL_COMMUNITY): Payer: Self-pay

## 2013-08-21 LAB — CBC WITH DIFFERENTIAL/PLATELET
BASOS ABS: 0 10*3/uL (ref 0.0–0.1)
BASOS PCT: 0 % (ref 0–1)
Eosinophils Absolute: 0.1 10*3/uL (ref 0.0–0.7)
Eosinophils Relative: 1 % (ref 0–5)
HCT: 34 % — ABNORMAL LOW (ref 39.0–52.0)
Hemoglobin: 10.9 g/dL — ABNORMAL LOW (ref 13.0–17.0)
Lymphocytes Relative: 16 % (ref 12–46)
Lymphs Abs: 2.1 10*3/uL (ref 0.7–4.0)
MCH: 30.5 pg (ref 26.0–34.0)
MCHC: 32.1 g/dL (ref 30.0–36.0)
MCV: 95.2 fL (ref 78.0–100.0)
Monocytes Absolute: 2.1 10*3/uL — ABNORMAL HIGH (ref 0.1–1.0)
Monocytes Relative: 16 % — ABNORMAL HIGH (ref 3–12)
NEUTROS PCT: 67 % (ref 43–77)
Neutro Abs: 8.6 10*3/uL — ABNORMAL HIGH (ref 1.7–7.7)
PLATELETS: 212 10*3/uL (ref 150–400)
RBC: 3.57 MIL/uL — AB (ref 4.22–5.81)
RDW: 14.8 % (ref 11.5–15.5)
WBC: 13 10*3/uL — ABNORMAL HIGH (ref 4.0–10.5)

## 2013-08-21 LAB — URINE CULTURE
Colony Count: NO GROWTH
Culture: NO GROWTH

## 2013-08-21 LAB — COMPREHENSIVE METABOLIC PANEL
ALBUMIN: 2.7 g/dL — AB (ref 3.5–5.2)
ALK PHOS: 72 U/L (ref 39–117)
ALT: 11 U/L (ref 0–53)
AST: 21 U/L (ref 0–37)
BUN: 20 mg/dL (ref 6–23)
CO2: 28 mEq/L (ref 19–32)
Calcium: 9.2 mg/dL (ref 8.4–10.5)
Chloride: 96 mEq/L (ref 96–112)
Creatinine, Ser: 1.1 mg/dL (ref 0.50–1.35)
GFR calc Af Amer: 73 mL/min — ABNORMAL LOW (ref >90)
GFR calc non Af Amer: 63 mL/min — ABNORMAL LOW (ref >90)
Glucose, Bld: 127 mg/dL — ABNORMAL HIGH (ref 70–99)
POTASSIUM: 4.3 meq/L (ref 3.7–5.3)
Sodium: 136 mEq/L — ABNORMAL LOW (ref 137–147)
TOTAL PROTEIN: 7.4 g/dL (ref 6.0–8.3)
Total Bilirubin: 0.9 mg/dL (ref 0.3–1.2)

## 2013-08-21 LAB — PROCALCITONIN: Procalcitonin: 0.21 ng/mL

## 2013-08-21 LAB — PROTIME-INR
INR: 1.99 — ABNORMAL HIGH (ref 0.00–1.49)
PROTHROMBIN TIME: 22 s — AB (ref 11.6–15.2)

## 2013-08-21 LAB — C-REACTIVE PROTEIN: CRP: 17.6 mg/dL — AB (ref <0.60)

## 2013-08-21 LAB — SEDIMENTATION RATE: Sed Rate: 114 mm/hr — ABNORMAL HIGH (ref 0–16)

## 2013-08-22 ENCOUNTER — Encounter (HOSPITAL_COMMUNITY): Payer: Self-pay | Admitting: Orthopedic Surgery

## 2013-08-22 LAB — CBC WITH DIFFERENTIAL/PLATELET
BASOS PCT: 0 % (ref 0–1)
Basophils Absolute: 0 10*3/uL (ref 0.0–0.1)
EOS ABS: 0.1 10*3/uL (ref 0.0–0.7)
EOS PCT: 1 % (ref 0–5)
HCT: 32.4 % — ABNORMAL LOW (ref 39.0–52.0)
HEMOGLOBIN: 10.5 g/dL — AB (ref 13.0–17.0)
Lymphocytes Relative: 20 % (ref 12–46)
Lymphs Abs: 2.6 10*3/uL (ref 0.7–4.0)
MCH: 30.5 pg (ref 26.0–34.0)
MCHC: 32.4 g/dL (ref 30.0–36.0)
MCV: 94.2 fL (ref 78.0–100.0)
MONOS PCT: 19 % — AB (ref 3–12)
Monocytes Absolute: 2.3 10*3/uL — ABNORMAL HIGH (ref 0.1–1.0)
Neutro Abs: 7.7 10*3/uL (ref 1.7–7.7)
Neutrophils Relative %: 60 % (ref 43–77)
PLATELETS: 264 10*3/uL (ref 150–400)
RBC: 3.44 MIL/uL — ABNORMAL LOW (ref 4.22–5.81)
RDW: 14.6 % (ref 11.5–15.5)
WBC: 12.7 10*3/uL — ABNORMAL HIGH (ref 4.0–10.5)

## 2013-08-22 LAB — BASIC METABOLIC PANEL
BUN: 18 mg/dL (ref 6–23)
CALCIUM: 9.2 mg/dL (ref 8.4–10.5)
CO2: 24 mEq/L (ref 19–32)
CREATININE: 1.06 mg/dL (ref 0.50–1.35)
Chloride: 96 mEq/L (ref 96–112)
GFR, EST AFRICAN AMERICAN: 77 mL/min — AB (ref >90)
GFR, EST NON AFRICAN AMERICAN: 66 mL/min — AB (ref >90)
GLUCOSE: 97 mg/dL (ref 70–99)
POTASSIUM: 4.5 meq/L (ref 3.7–5.3)
Sodium: 135 mEq/L — ABNORMAL LOW (ref 137–147)

## 2013-08-22 LAB — PROTIME-INR
INR: 2.27 — ABNORMAL HIGH (ref 0.00–1.49)
PROTHROMBIN TIME: 24.3 s — AB (ref 11.6–15.2)

## 2013-08-23 LAB — PREALBUMIN: Prealbumin: 11 mg/dL — ABNORMAL LOW (ref 17.0–34.0)

## 2013-08-23 LAB — CBC WITH DIFFERENTIAL/PLATELET
Basophils Absolute: 0 10*3/uL (ref 0.0–0.1)
Basophils Relative: 0 % (ref 0–1)
Eosinophils Absolute: 0.1 10*3/uL (ref 0.0–0.7)
Eosinophils Relative: 1 % (ref 0–5)
HEMATOCRIT: 32.6 % — AB (ref 39.0–52.0)
HEMOGLOBIN: 10.5 g/dL — AB (ref 13.0–17.0)
LYMPHS ABS: 2.5 10*3/uL (ref 0.7–4.0)
LYMPHS PCT: 21 % (ref 12–46)
MCH: 30.3 pg (ref 26.0–34.0)
MCHC: 32.2 g/dL (ref 30.0–36.0)
MCV: 94.2 fL (ref 78.0–100.0)
MONO ABS: 2.1 10*3/uL — AB (ref 0.1–1.0)
Monocytes Relative: 18 % — ABNORMAL HIGH (ref 3–12)
Neutro Abs: 7 10*3/uL (ref 1.7–7.7)
Neutrophils Relative %: 60 % (ref 43–77)
Platelets: 324 10*3/uL (ref 150–400)
RBC: 3.46 MIL/uL — AB (ref 4.22–5.81)
RDW: 14.9 % (ref 11.5–15.5)
WBC: 11.7 10*3/uL — ABNORMAL HIGH (ref 4.0–10.5)

## 2013-08-23 LAB — PROTIME-INR
INR: 2.15 — ABNORMAL HIGH (ref 0.00–1.49)
Prothrombin Time: 23.3 seconds — ABNORMAL HIGH (ref 11.6–15.2)

## 2013-08-23 LAB — AMMONIA: Ammonia: 26 umol/L (ref 11–60)

## 2013-08-23 LAB — PROCALCITONIN: Procalcitonin: <0.1 ng/mL

## 2013-08-24 LAB — BASIC METABOLIC PANEL
BUN: 27 mg/dL — ABNORMAL HIGH (ref 6–23)
CO2: 25 mEq/L (ref 19–32)
Calcium: 9.5 mg/dL (ref 8.4–10.5)
Chloride: 97 mEq/L (ref 96–112)
Creatinine, Ser: 1.07 mg/dL (ref 0.50–1.35)
GFR calc Af Amer: 76 mL/min — ABNORMAL LOW (ref >90)
GFR, EST NON AFRICAN AMERICAN: 65 mL/min — AB (ref >90)
GLUCOSE: 114 mg/dL — AB (ref 70–99)
Potassium: 4.8 mEq/L (ref 3.7–5.3)
SODIUM: 137 meq/L (ref 137–147)

## 2013-08-24 LAB — CBC
HCT: 32.9 % — ABNORMAL LOW (ref 39.0–52.0)
HEMOGLOBIN: 10.5 g/dL — AB (ref 13.0–17.0)
MCH: 30.2 pg (ref 26.0–34.0)
MCHC: 31.9 g/dL (ref 30.0–36.0)
MCV: 94.5 fL (ref 78.0–100.0)
PLATELETS: 347 10*3/uL (ref 150–400)
RBC: 3.48 MIL/uL — ABNORMAL LOW (ref 4.22–5.81)
RDW: 14.9 % (ref 11.5–15.5)
WBC: 9.2 10*3/uL (ref 4.0–10.5)

## 2013-08-24 LAB — PROTIME-INR
INR: 1.86 — AB (ref 0.00–1.49)
PROTHROMBIN TIME: 20.9 s — AB (ref 11.6–15.2)

## 2013-08-24 LAB — PRO B NATRIURETIC PEPTIDE: PRO B NATRI PEPTIDE: 1582 pg/mL — AB (ref 0–450)

## 2013-08-25 LAB — PROTIME-INR
INR: 1.79 — ABNORMAL HIGH (ref 0.00–1.49)
PROTHROMBIN TIME: 20.3 s — AB (ref 11.6–15.2)

## 2013-08-26 LAB — BASIC METABOLIC PANEL
BUN: 22 mg/dL (ref 6–23)
CO2: 28 meq/L (ref 19–32)
CREATININE: 1.19 mg/dL (ref 0.50–1.35)
Calcium: 9.4 mg/dL (ref 8.4–10.5)
Chloride: 94 mEq/L — ABNORMAL LOW (ref 96–112)
GFR, EST AFRICAN AMERICAN: 67 mL/min — AB (ref >90)
GFR, EST NON AFRICAN AMERICAN: 58 mL/min — AB (ref >90)
Glucose, Bld: 101 mg/dL — ABNORMAL HIGH (ref 70–99)
Potassium: 4.3 mEq/L (ref 3.7–5.3)
SODIUM: 136 meq/L — AB (ref 137–147)

## 2013-08-26 LAB — CBC WITH DIFFERENTIAL/PLATELET
BASOS ABS: 0 10*3/uL (ref 0.0–0.1)
BASOS PCT: 0 % (ref 0–1)
EOS ABS: 0.2 10*3/uL (ref 0.0–0.7)
EOS PCT: 2 % (ref 0–5)
HCT: 33.5 % — ABNORMAL LOW (ref 39.0–52.0)
Hemoglobin: 10.9 g/dL — ABNORMAL LOW (ref 13.0–17.0)
LYMPHS PCT: 25 % (ref 12–46)
Lymphs Abs: 2.2 10*3/uL (ref 0.7–4.0)
MCH: 30.2 pg (ref 26.0–34.0)
MCHC: 32.5 g/dL (ref 30.0–36.0)
MCV: 92.8 fL (ref 78.0–100.0)
MONO ABS: 2 10*3/uL — AB (ref 0.1–1.0)
Monocytes Relative: 23 % — ABNORMAL HIGH (ref 3–12)
Neutro Abs: 4.4 10*3/uL (ref 1.7–7.7)
Neutrophils Relative %: 51 % (ref 43–77)
PLATELETS: 496 10*3/uL — AB (ref 150–400)
RBC: 3.61 MIL/uL — ABNORMAL LOW (ref 4.22–5.81)
RDW: 14.8 % (ref 11.5–15.5)
WBC: 8.8 10*3/uL (ref 4.0–10.5)

## 2013-08-26 LAB — PHOSPHORUS: PHOSPHORUS: 4.8 mg/dL — AB (ref 2.3–4.6)

## 2013-08-26 LAB — MAGNESIUM: Magnesium: 1.8 mg/dL (ref 1.5–2.5)

## 2013-08-26 LAB — PROTIME-INR
INR: 1.86 — ABNORMAL HIGH (ref 0.00–1.49)
Prothrombin Time: 20.9 seconds — ABNORMAL HIGH (ref 11.6–15.2)

## 2013-08-27 LAB — PROTIME-INR
INR: 1.62 — ABNORMAL HIGH (ref 0.00–1.49)
Prothrombin Time: 18.8 seconds — ABNORMAL HIGH (ref 11.6–15.2)

## 2013-08-28 ENCOUNTER — Other Ambulatory Visit (HOSPITAL_COMMUNITY): Payer: Self-pay

## 2013-08-28 LAB — COMPREHENSIVE METABOLIC PANEL
ALT: 15 U/L (ref 0–53)
AST: 21 U/L (ref 0–37)
Albumin: 2.8 g/dL — ABNORMAL LOW (ref 3.5–5.2)
Alkaline Phosphatase: 105 U/L (ref 39–117)
BILIRUBIN TOTAL: 0.7 mg/dL (ref 0.3–1.2)
BUN: 30 mg/dL — AB (ref 6–23)
CO2: 27 mEq/L (ref 19–32)
CREATININE: 1.5 mg/dL — AB (ref 0.50–1.35)
Calcium: 9.9 mg/dL (ref 8.4–10.5)
Chloride: 92 mEq/L — ABNORMAL LOW (ref 96–112)
GFR calc Af Amer: 50 mL/min — ABNORMAL LOW (ref >90)
GFR calc non Af Amer: 43 mL/min — ABNORMAL LOW (ref >90)
Glucose, Bld: 122 mg/dL — ABNORMAL HIGH (ref 70–99)
Potassium: 4.9 mEq/L (ref 3.7–5.3)
Sodium: 134 mEq/L — ABNORMAL LOW (ref 137–147)
TOTAL PROTEIN: 8.1 g/dL (ref 6.0–8.3)

## 2013-08-28 LAB — CBC WITH DIFFERENTIAL/PLATELET
BASOS PCT: 0 % (ref 0–1)
Basophils Absolute: 0 10*3/uL (ref 0.0–0.1)
EOS ABS: 0.1 10*3/uL (ref 0.0–0.7)
Eosinophils Relative: 1 % (ref 0–5)
HEMATOCRIT: 37.4 % — AB (ref 39.0–52.0)
Hemoglobin: 12 g/dL — ABNORMAL LOW (ref 13.0–17.0)
Lymphocytes Relative: 22 % (ref 12–46)
Lymphs Abs: 2.5 10*3/uL (ref 0.7–4.0)
MCH: 30.2 pg (ref 26.0–34.0)
MCHC: 32.1 g/dL (ref 30.0–36.0)
MCV: 94.2 fL (ref 78.0–100.0)
MONO ABS: 1.9 10*3/uL — AB (ref 0.1–1.0)
MONOS PCT: 17 % — AB (ref 3–12)
NEUTROS PCT: 60 % (ref 43–77)
Neutro Abs: 6.7 10*3/uL (ref 1.7–7.7)
Platelets: 636 10*3/uL — ABNORMAL HIGH (ref 150–400)
RBC: 3.97 MIL/uL — AB (ref 4.22–5.81)
RDW: 15.2 % (ref 11.5–15.5)
WBC: 11.1 10*3/uL — ABNORMAL HIGH (ref 4.0–10.5)

## 2013-08-28 LAB — MAGNESIUM: MAGNESIUM: 1.8 mg/dL (ref 1.5–2.5)

## 2013-08-28 LAB — PREALBUMIN: Prealbumin: 12.5 mg/dL — ABNORMAL LOW (ref 17.0–34.0)

## 2013-08-28 LAB — PROTIME-INR
INR: 1.97 — ABNORMAL HIGH (ref 0.00–1.49)
Prothrombin Time: 21.8 seconds — ABNORMAL HIGH (ref 11.6–15.2)

## 2013-08-28 LAB — SEDIMENTATION RATE: SED RATE: 99 mm/h — AB (ref 0–16)

## 2013-08-28 LAB — C-REACTIVE PROTEIN: CRP: 6.1 mg/dL — ABNORMAL HIGH (ref <0.60)

## 2013-08-28 LAB — PHOSPHORUS: PHOSPHORUS: 4.7 mg/dL — AB (ref 2.3–4.6)

## 2013-08-29 LAB — CBC
HEMATOCRIT: 35.2 % — AB (ref 39.0–52.0)
Hemoglobin: 11.5 g/dL — ABNORMAL LOW (ref 13.0–17.0)
MCH: 30.6 pg (ref 26.0–34.0)
MCHC: 32.7 g/dL (ref 30.0–36.0)
MCV: 93.6 fL (ref 78.0–100.0)
Platelets: 686 10*3/uL — ABNORMAL HIGH (ref 150–400)
RBC: 3.76 MIL/uL — AB (ref 4.22–5.81)
RDW: 15.2 % (ref 11.5–15.5)
WBC: 11 10*3/uL — AB (ref 4.0–10.5)

## 2013-08-29 LAB — PROTIME-INR
INR: 2.36 — ABNORMAL HIGH (ref 0.00–1.49)
Prothrombin Time: 25 seconds — ABNORMAL HIGH (ref 11.6–15.2)

## 2013-08-29 LAB — BASIC METABOLIC PANEL
BUN: 34 mg/dL — ABNORMAL HIGH (ref 6–23)
CO2: 28 mEq/L (ref 19–32)
Calcium: 9.8 mg/dL (ref 8.4–10.5)
Chloride: 91 mEq/L — ABNORMAL LOW (ref 96–112)
Creatinine, Ser: 1.46 mg/dL — ABNORMAL HIGH (ref 0.50–1.35)
GFR, EST AFRICAN AMERICAN: 52 mL/min — AB (ref >90)
GFR, EST NON AFRICAN AMERICAN: 45 mL/min — AB (ref >90)
Glucose, Bld: 130 mg/dL — ABNORMAL HIGH (ref 70–99)
POTASSIUM: 4.7 meq/L (ref 3.7–5.3)
Sodium: 132 mEq/L — ABNORMAL LOW (ref 137–147)

## 2013-08-29 LAB — CULTURE, BLOOD (ROUTINE X 2): Culture: NO GROWTH

## 2013-08-30 ENCOUNTER — Other Ambulatory Visit (HOSPITAL_COMMUNITY): Payer: Self-pay

## 2013-08-30 LAB — PROTIME-INR
INR: 2.36 — AB (ref 0.00–1.49)
PROTHROMBIN TIME: 25 s — AB (ref 11.6–15.2)

## 2013-08-31 ENCOUNTER — Other Ambulatory Visit (HOSPITAL_COMMUNITY): Payer: Self-pay

## 2013-08-31 LAB — BASIC METABOLIC PANEL
BUN: 34 mg/dL — ABNORMAL HIGH (ref 6–23)
CO2: 24 meq/L (ref 19–32)
Calcium: 10.2 mg/dL (ref 8.4–10.5)
Chloride: 92 mEq/L — ABNORMAL LOW (ref 96–112)
Creatinine, Ser: 1.64 mg/dL — ABNORMAL HIGH (ref 0.50–1.35)
GFR calc Af Amer: 45 mL/min — ABNORMAL LOW (ref >90)
GFR, EST NON AFRICAN AMERICAN: 39 mL/min — AB (ref >90)
Glucose, Bld: 120 mg/dL — ABNORMAL HIGH (ref 70–99)
Potassium: 5.1 mEq/L (ref 3.7–5.3)
Sodium: 133 mEq/L — ABNORMAL LOW (ref 137–147)

## 2013-08-31 LAB — CBC
HCT: 40.3 % (ref 39.0–52.0)
Hemoglobin: 13.1 g/dL (ref 13.0–17.0)
MCH: 31 pg (ref 26.0–34.0)
MCHC: 32.5 g/dL (ref 30.0–36.0)
MCV: 95.5 fL (ref 78.0–100.0)
PLATELETS: 699 10*3/uL — AB (ref 150–400)
RBC: 4.22 MIL/uL (ref 4.22–5.81)
RDW: 15.4 % (ref 11.5–15.5)
WBC: 11.6 10*3/uL — ABNORMAL HIGH (ref 4.0–10.5)

## 2013-08-31 LAB — PROTIME-INR
INR: 2.81 — ABNORMAL HIGH (ref 0.00–1.49)
Prothrombin Time: 28.6 seconds — ABNORMAL HIGH (ref 11.6–15.2)

## 2013-09-01 ENCOUNTER — Other Ambulatory Visit (HOSPITAL_COMMUNITY): Payer: Self-pay

## 2013-09-01 LAB — URINALYSIS, ROUTINE W REFLEX MICROSCOPIC
Bilirubin Urine: NEGATIVE
GLUCOSE, UA: NEGATIVE mg/dL
HGB URINE DIPSTICK: NEGATIVE
KETONES UR: NEGATIVE mg/dL
LEUKOCYTES UA: NEGATIVE
Nitrite: NEGATIVE
PROTEIN: NEGATIVE mg/dL
Specific Gravity, Urine: 1.017 (ref 1.005–1.030)
Urobilinogen, UA: 1 mg/dL (ref 0.0–1.0)
pH: 5 (ref 5.0–8.0)

## 2013-09-01 LAB — BASIC METABOLIC PANEL
BUN: 36 mg/dL — AB (ref 6–23)
CO2: 24 mEq/L (ref 19–32)
CREATININE: 1.53 mg/dL — AB (ref 0.50–1.35)
Calcium: 9.7 mg/dL (ref 8.4–10.5)
Chloride: 96 mEq/L (ref 96–112)
GFR, EST AFRICAN AMERICAN: 49 mL/min — AB (ref >90)
GFR, EST NON AFRICAN AMERICAN: 42 mL/min — AB (ref >90)
Glucose, Bld: 131 mg/dL — ABNORMAL HIGH (ref 70–99)
Potassium: 4.4 mEq/L (ref 3.7–5.3)
Sodium: 136 mEq/L — ABNORMAL LOW (ref 137–147)

## 2013-09-01 LAB — CBC WITH DIFFERENTIAL/PLATELET
BASOS PCT: 0 % (ref 0–1)
Basophils Absolute: 0 10*3/uL (ref 0.0–0.1)
EOS ABS: 0.1 10*3/uL (ref 0.0–0.7)
Eosinophils Relative: 1 % (ref 0–5)
HEMATOCRIT: 38.8 % — AB (ref 39.0–52.0)
HEMOGLOBIN: 12.5 g/dL — AB (ref 13.0–17.0)
Lymphocytes Relative: 23 % (ref 12–46)
Lymphs Abs: 2.8 10*3/uL (ref 0.7–4.0)
MCH: 30.4 pg (ref 26.0–34.0)
MCHC: 32.2 g/dL (ref 30.0–36.0)
MCV: 94.4 fL (ref 78.0–100.0)
MONO ABS: 1.9 10*3/uL — AB (ref 0.1–1.0)
MONOS PCT: 16 % — AB (ref 3–12)
Neutro Abs: 7.5 10*3/uL (ref 1.7–7.7)
Neutrophils Relative %: 60 % (ref 43–77)
Platelets: 648 10*3/uL — ABNORMAL HIGH (ref 150–400)
RBC: 4.11 MIL/uL — ABNORMAL LOW (ref 4.22–5.81)
RDW: 15.4 % (ref 11.5–15.5)
WBC: 12.3 10*3/uL — ABNORMAL HIGH (ref 4.0–10.5)

## 2013-09-01 LAB — BLOOD GAS, ARTERIAL
ACID-BASE EXCESS: 3.2 mmol/L — AB (ref 0.0–2.0)
Bicarbonate: 26.6 mEq/L — ABNORMAL HIGH (ref 20.0–24.0)
FIO2: 21 %
O2 SAT: 95.3 %
PO2 ART: 74.7 mmHg — AB (ref 80.0–100.0)
Patient temperature: 98.6
TCO2: 27.7 mmol/L (ref 0–100)
pCO2 arterial: 36.5 mmHg (ref 35.0–45.0)
pH, Arterial: 7.474 — ABNORMAL HIGH (ref 7.350–7.450)

## 2013-09-01 LAB — PROTIME-INR
INR: 2.87 — ABNORMAL HIGH (ref 0.00–1.49)
Prothrombin Time: 29.1 seconds — ABNORMAL HIGH (ref 11.6–15.2)

## 2013-09-01 LAB — MAGNESIUM: Magnesium: 1.7 mg/dL (ref 1.5–2.5)

## 2013-09-01 LAB — PHOSPHORUS: Phosphorus: 4.7 mg/dL — ABNORMAL HIGH (ref 2.3–4.6)

## 2013-09-01 LAB — AMMONIA: Ammonia: 53 umol/L (ref 11–60)

## 2013-09-02 ENCOUNTER — Other Ambulatory Visit (HOSPITAL_COMMUNITY): Payer: Self-pay

## 2013-09-02 DIAGNOSIS — M7989 Other specified soft tissue disorders: Secondary | ICD-10-CM

## 2013-09-02 LAB — PROTIME-INR
INR: 2.94 — AB (ref 0.00–1.49)
PROTHROMBIN TIME: 29.6 s — AB (ref 11.6–15.2)

## 2013-09-02 LAB — CBC WITH DIFFERENTIAL/PLATELET
BASOS ABS: 0 10*3/uL (ref 0.0–0.1)
BASOS PCT: 0 % (ref 0–1)
Eosinophils Absolute: 0.1 10*3/uL (ref 0.0–0.7)
Eosinophils Relative: 1 % (ref 0–5)
HCT: 40.7 % (ref 39.0–52.0)
Hemoglobin: 13.2 g/dL (ref 13.0–17.0)
Lymphocytes Relative: 16 % (ref 12–46)
Lymphs Abs: 2.3 10*3/uL (ref 0.7–4.0)
MCH: 30.7 pg (ref 26.0–34.0)
MCHC: 32.4 g/dL (ref 30.0–36.0)
MCV: 94.7 fL (ref 78.0–100.0)
Monocytes Absolute: 1.8 10*3/uL — ABNORMAL HIGH (ref 0.1–1.0)
Monocytes Relative: 13 % — ABNORMAL HIGH (ref 3–12)
NEUTROS PCT: 70 % (ref 43–77)
Neutro Abs: 10.2 10*3/uL — ABNORMAL HIGH (ref 1.7–7.7)
Platelets: 650 10*3/uL — ABNORMAL HIGH (ref 150–400)
RBC: 4.3 MIL/uL (ref 4.22–5.81)
RDW: 15.7 % — AB (ref 11.5–15.5)
WBC: 14.4 10*3/uL — ABNORMAL HIGH (ref 4.0–10.5)

## 2013-09-02 LAB — URINE CULTURE
Colony Count: NO GROWTH
Culture: NO GROWTH

## 2013-09-02 LAB — BASIC METABOLIC PANEL
BUN: 35 mg/dL — ABNORMAL HIGH (ref 6–23)
CHLORIDE: 94 meq/L — AB (ref 96–112)
CO2: 22 mEq/L (ref 19–32)
Calcium: 9.7 mg/dL (ref 8.4–10.5)
Creatinine, Ser: 1.54 mg/dL — ABNORMAL HIGH (ref 0.50–1.35)
GFR, EST AFRICAN AMERICAN: 49 mL/min — AB (ref >90)
GFR, EST NON AFRICAN AMERICAN: 42 mL/min — AB (ref >90)
Glucose, Bld: 112 mg/dL — ABNORMAL HIGH (ref 70–99)
POTASSIUM: 4.3 meq/L (ref 3.7–5.3)
Sodium: 137 mEq/L (ref 137–147)

## 2013-09-02 LAB — PROCALCITONIN: PROCALCITONIN: 0.17 ng/mL

## 2013-09-03 LAB — PROTIME-INR
INR: 3.46 — AB (ref 0.00–1.49)
Prothrombin Time: 33.5 seconds — ABNORMAL HIGH (ref 11.6–15.2)

## 2013-09-04 LAB — PROTIME-INR
INR: 3.91 — ABNORMAL HIGH (ref 0.00–1.49)
PROTHROMBIN TIME: 36.8 s — AB (ref 11.6–15.2)

## 2013-09-04 LAB — COMPREHENSIVE METABOLIC PANEL
ALBUMIN: 2.5 g/dL — AB (ref 3.5–5.2)
ALK PHOS: 88 U/L (ref 39–117)
ALT: 13 U/L (ref 0–53)
AST: 20 U/L (ref 0–37)
BUN: 29 mg/dL — ABNORMAL HIGH (ref 6–23)
CALCIUM: 8.9 mg/dL (ref 8.4–10.5)
CO2: 25 mEq/L (ref 19–32)
CREATININE: 1.24 mg/dL (ref 0.50–1.35)
Chloride: 96 mEq/L (ref 96–112)
GFR calc non Af Amer: 55 mL/min — ABNORMAL LOW (ref >90)
GFR, EST AFRICAN AMERICAN: 63 mL/min — AB (ref >90)
Glucose, Bld: 115 mg/dL — ABNORMAL HIGH (ref 70–99)
POTASSIUM: 3.2 meq/L — AB (ref 3.7–5.3)
Sodium: 136 mEq/L — ABNORMAL LOW (ref 137–147)
TOTAL PROTEIN: 6.9 g/dL (ref 6.0–8.3)
Total Bilirubin: 1.2 mg/dL (ref 0.3–1.2)

## 2013-09-04 LAB — CBC WITH DIFFERENTIAL/PLATELET
BASOS PCT: 0 % (ref 0–1)
Basophils Absolute: 0 10*3/uL (ref 0.0–0.1)
EOS PCT: 1 % (ref 0–5)
Eosinophils Absolute: 0.2 10*3/uL (ref 0.0–0.7)
HCT: 37 % — ABNORMAL LOW (ref 39.0–52.0)
Hemoglobin: 11.9 g/dL — ABNORMAL LOW (ref 13.0–17.0)
Lymphocytes Relative: 12 % (ref 12–46)
Lymphs Abs: 1.8 10*3/uL (ref 0.7–4.0)
MCH: 30.2 pg (ref 26.0–34.0)
MCHC: 32.2 g/dL (ref 30.0–36.0)
MCV: 93.9 fL (ref 78.0–100.0)
Monocytes Absolute: 1.6 10*3/uL — ABNORMAL HIGH (ref 0.1–1.0)
Monocytes Relative: 11 % (ref 3–12)
NEUTROS PCT: 76 % (ref 43–77)
Neutro Abs: 11.3 10*3/uL — ABNORMAL HIGH (ref 1.7–7.7)
PLATELETS: 468 10*3/uL — AB (ref 150–400)
RBC: 3.94 MIL/uL — ABNORMAL LOW (ref 4.22–5.81)
RDW: 15.2 % (ref 11.5–15.5)
WBC: 14.8 10*3/uL — ABNORMAL HIGH (ref 4.0–10.5)

## 2013-09-04 LAB — PROCALCITONIN

## 2013-09-04 LAB — PREALBUMIN: Prealbumin: 13.6 mg/dL — ABNORMAL LOW (ref 17.0–34.0)

## 2013-09-04 LAB — VANCOMYCIN, TROUGH: VANCOMYCIN TR: 7.4 ug/mL — AB (ref 10.0–20.0)

## 2013-09-05 LAB — PROTIME-INR
INR: 2.89 — AB (ref 0.00–1.49)
PROTHROMBIN TIME: 29.2 s — AB (ref 11.6–15.2)

## 2013-09-05 LAB — BASIC METABOLIC PANEL
BUN: 17 mg/dL (ref 6–23)
CO2: 27 meq/L (ref 19–32)
CREATININE: 0.93 mg/dL (ref 0.50–1.35)
Calcium: 9.3 mg/dL (ref 8.4–10.5)
Chloride: 97 mEq/L (ref 96–112)
GFR calc Af Amer: >90 mL/min (ref >90)
GFR calc non Af Amer: 80 mL/min — ABNORMAL LOW (ref >90)
Glucose, Bld: 93 mg/dL (ref 70–99)
Potassium: 3.8 mEq/L (ref 3.7–5.3)
Sodium: 137 mEq/L (ref 137–147)

## 2013-09-05 LAB — CBC WITH DIFFERENTIAL/PLATELET
BASOS PCT: 0 % (ref 0–1)
Basophils Absolute: 0 10*3/uL (ref 0.0–0.1)
EOS ABS: 0.1 10*3/uL (ref 0.0–0.7)
Eosinophils Relative: 1 % (ref 0–5)
HCT: 37.3 % — ABNORMAL LOW (ref 39.0–52.0)
Hemoglobin: 12.1 g/dL — ABNORMAL LOW (ref 13.0–17.0)
Lymphocytes Relative: 14 % (ref 12–46)
Lymphs Abs: 1.9 10*3/uL (ref 0.7–4.0)
MCH: 30.3 pg (ref 26.0–34.0)
MCHC: 32.4 g/dL (ref 30.0–36.0)
MCV: 93.3 fL (ref 78.0–100.0)
MONOS PCT: 12 % (ref 3–12)
Monocytes Absolute: 1.6 10*3/uL — ABNORMAL HIGH (ref 0.1–1.0)
NEUTROS ABS: 10.1 10*3/uL — AB (ref 1.7–7.7)
Neutrophils Relative %: 73 % (ref 43–77)
PLATELETS: 413 10*3/uL — AB (ref 150–400)
RBC: 4 MIL/uL — ABNORMAL LOW (ref 4.22–5.81)
RDW: 14.6 % (ref 11.5–15.5)
WBC: 13.7 10*3/uL — ABNORMAL HIGH (ref 4.0–10.5)

## 2013-09-05 LAB — PROCALCITONIN

## 2013-09-06 LAB — PROTIME-INR
INR: 3.21 — ABNORMAL HIGH (ref 0.00–1.49)
Prothrombin Time: 31.7 seconds — ABNORMAL HIGH (ref 11.6–15.2)

## 2013-09-07 LAB — CULTURE, BLOOD (ROUTINE X 2)
CULTURE: NO GROWTH
Culture: NO GROWTH

## 2013-09-07 LAB — PROCALCITONIN: Procalcitonin: <0.1 ng/mL

## 2013-09-07 LAB — PROTIME-INR
INR: 2.72 — ABNORMAL HIGH (ref 0.00–1.49)
PROTHROMBIN TIME: 27.9 s — AB (ref 11.6–15.2)

## 2013-09-08 LAB — CBC
HEMATOCRIT: 38.2 % — AB (ref 39.0–52.0)
Hemoglobin: 12.4 g/dL — ABNORMAL LOW (ref 13.0–17.0)
MCH: 30.1 pg (ref 26.0–34.0)
MCHC: 32.5 g/dL (ref 30.0–36.0)
MCV: 92.7 fL (ref 78.0–100.0)
Platelets: 287 10*3/uL (ref 150–400)
RBC: 4.12 MIL/uL — ABNORMAL LOW (ref 4.22–5.81)
RDW: 14.5 % (ref 11.5–15.5)
WBC: 15.3 10*3/uL — AB (ref 4.0–10.5)

## 2013-09-08 LAB — BASIC METABOLIC PANEL WITH GFR
BUN: 13 mg/dL (ref 6–23)
CO2: 30 meq/L (ref 19–32)
Calcium: 9.3 mg/dL (ref 8.4–10.5)
Chloride: 96 meq/L (ref 96–112)
Creatinine, Ser: 1 mg/dL (ref 0.50–1.35)
GFR calc Af Amer: 82 mL/min — ABNORMAL LOW
GFR calc non Af Amer: 71 mL/min — ABNORMAL LOW
Glucose, Bld: 99 mg/dL (ref 70–99)
Potassium: 3.7 meq/L (ref 3.7–5.3)
Sodium: 139 meq/L (ref 137–147)

## 2013-09-08 LAB — PROTIME-INR
INR: 2.34 — ABNORMAL HIGH (ref 0.00–1.49)
Prothrombin Time: 24.9 s — ABNORMAL HIGH (ref 11.6–15.2)

## 2013-09-09 ENCOUNTER — Other Ambulatory Visit (HOSPITAL_COMMUNITY): Payer: Self-pay

## 2013-09-09 LAB — CBC WITH DIFFERENTIAL/PLATELET
BASOS ABS: 0.1 10*3/uL (ref 0.0–0.1)
Basophils Relative: 0 % (ref 0–1)
EOS ABS: 0.2 10*3/uL (ref 0.0–0.7)
Eosinophils Relative: 2 % (ref 0–5)
HCT: 36 % — ABNORMAL LOW (ref 39.0–52.0)
Hemoglobin: 11.7 g/dL — ABNORMAL LOW (ref 13.0–17.0)
Lymphocytes Relative: 27 % (ref 12–46)
Lymphs Abs: 3.2 10*3/uL (ref 0.7–4.0)
MCH: 30.1 pg (ref 26.0–34.0)
MCHC: 32.5 g/dL (ref 30.0–36.0)
MCV: 92.5 fL (ref 78.0–100.0)
Monocytes Absolute: 1.6 10*3/uL — ABNORMAL HIGH (ref 0.1–1.0)
Monocytes Relative: 14 % — ABNORMAL HIGH (ref 3–12)
NEUTROS PCT: 57 % (ref 43–77)
Neutro Abs: 7.1 10*3/uL (ref 1.7–7.7)
PLATELETS: 225 10*3/uL (ref 150–400)
RBC: 3.89 MIL/uL — ABNORMAL LOW (ref 4.22–5.81)
RDW: 14.5 % (ref 11.5–15.5)
WBC: 12.2 10*3/uL — AB (ref 4.0–10.5)

## 2013-09-09 LAB — BASIC METABOLIC PANEL
BUN: 15 mg/dL (ref 6–23)
CALCIUM: 9.2 mg/dL (ref 8.4–10.5)
CO2: 31 mEq/L (ref 19–32)
Chloride: 94 mEq/L — ABNORMAL LOW (ref 96–112)
Creatinine, Ser: 0.94 mg/dL (ref 0.50–1.35)
GFR, EST NON AFRICAN AMERICAN: 79 mL/min — AB (ref >90)
Glucose, Bld: 91 mg/dL (ref 70–99)
Potassium: 3.7 mEq/L (ref 3.7–5.3)
SODIUM: 137 meq/L (ref 137–147)

## 2013-09-09 LAB — PROTIME-INR
INR: 2.05 — ABNORMAL HIGH (ref 0.00–1.49)
PROTHROMBIN TIME: 22.5 s — AB (ref 11.6–15.2)

## 2013-09-09 LAB — PHOSPHORUS: PHOSPHORUS: 3.4 mg/dL (ref 2.3–4.6)

## 2013-09-09 LAB — MAGNESIUM: MAGNESIUM: 1.5 mg/dL (ref 1.5–2.5)

## 2013-09-10 LAB — PROTIME-INR
INR: 2.15 — ABNORMAL HIGH (ref 0.00–1.49)
Prothrombin Time: 23.3 seconds — ABNORMAL HIGH (ref 11.6–15.2)

## 2013-09-11 LAB — PROTIME-INR
INR: 2.11 — ABNORMAL HIGH (ref 0.00–1.49)
Prothrombin Time: 23 seconds — ABNORMAL HIGH (ref 11.6–15.2)

## 2013-09-12 ENCOUNTER — Other Ambulatory Visit (HOSPITAL_COMMUNITY): Payer: Self-pay

## 2013-09-12 LAB — CBC WITH DIFFERENTIAL/PLATELET
Basophils Absolute: 0 10*3/uL (ref 0.0–0.1)
Basophils Relative: 0 % (ref 0–1)
Eosinophils Absolute: 0.1 10*3/uL (ref 0.0–0.7)
Eosinophils Relative: 1 % (ref 0–5)
HEMATOCRIT: 36 % — AB (ref 39.0–52.0)
HEMOGLOBIN: 11.5 g/dL — AB (ref 13.0–17.0)
LYMPHS ABS: 3.1 10*3/uL (ref 0.7–4.0)
Lymphocytes Relative: 29 % (ref 12–46)
MCH: 29.9 pg (ref 26.0–34.0)
MCHC: 31.9 g/dL (ref 30.0–36.0)
MCV: 93.8 fL (ref 78.0–100.0)
MONO ABS: 1.3 10*3/uL — AB (ref 0.1–1.0)
Monocytes Relative: 12 % (ref 3–12)
Neutro Abs: 6.2 10*3/uL (ref 1.7–7.7)
Neutrophils Relative %: 58 % (ref 43–77)
PLATELETS: 224 10*3/uL (ref 150–400)
RBC: 3.84 MIL/uL — ABNORMAL LOW (ref 4.22–5.81)
RDW: 14.9 % (ref 11.5–15.5)
WBC: 10.6 10*3/uL — AB (ref 4.0–10.5)

## 2013-09-12 LAB — BASIC METABOLIC PANEL
BUN: 23 mg/dL (ref 6–23)
CALCIUM: 8.9 mg/dL (ref 8.4–10.5)
CO2: 26 meq/L (ref 19–32)
Chloride: 93 mEq/L — ABNORMAL LOW (ref 96–112)
Creatinine, Ser: 1.18 mg/dL (ref 0.50–1.35)
GFR calc Af Amer: 67 mL/min — ABNORMAL LOW (ref >90)
GFR calc non Af Amer: 58 mL/min — ABNORMAL LOW (ref >90)
GLUCOSE: 98 mg/dL (ref 70–99)
Potassium: 4.5 mEq/L (ref 3.7–5.3)
SODIUM: 136 meq/L — AB (ref 137–147)

## 2013-09-12 LAB — PROTIME-INR
INR: 1.66 — ABNORMAL HIGH (ref 0.00–1.49)
Prothrombin Time: 19.1 seconds — ABNORMAL HIGH (ref 11.6–15.2)

## 2013-09-13 LAB — CBC
HEMATOCRIT: 36.3 % — AB (ref 39.0–52.0)
Hemoglobin: 11.6 g/dL — ABNORMAL LOW (ref 13.0–17.0)
MCH: 30.1 pg (ref 26.0–34.0)
MCHC: 32 g/dL (ref 30.0–36.0)
MCV: 94 fL (ref 78.0–100.0)
Platelets: 187 10*3/uL (ref 150–400)
RBC: 3.86 MIL/uL — ABNORMAL LOW (ref 4.22–5.81)
RDW: 15.1 % (ref 11.5–15.5)
WBC: 11.9 10*3/uL — AB (ref 4.0–10.5)

## 2013-09-13 LAB — PROTIME-INR
INR: 1.63 — AB (ref 0.00–1.49)
Prothrombin Time: 18.9 seconds — ABNORMAL HIGH (ref 11.6–15.2)

## 2013-09-13 LAB — BASIC METABOLIC PANEL
BUN: 28 mg/dL — AB (ref 6–23)
CHLORIDE: 98 meq/L (ref 96–112)
CO2: 28 mEq/L (ref 19–32)
Calcium: 9.3 mg/dL (ref 8.4–10.5)
Creatinine, Ser: 1.17 mg/dL (ref 0.50–1.35)
GFR calc Af Amer: 68 mL/min — ABNORMAL LOW (ref >90)
GFR calc non Af Amer: 59 mL/min — ABNORMAL LOW (ref >90)
Glucose, Bld: 89 mg/dL (ref 70–99)
POTASSIUM: 4.1 meq/L (ref 3.7–5.3)
Sodium: 140 mEq/L (ref 137–147)

## 2013-09-14 LAB — PROTIME-INR
INR: 2 — ABNORMAL HIGH (ref 0.00–1.49)
Prothrombin Time: 22.1 seconds — ABNORMAL HIGH (ref 11.6–15.2)

## 2014-01-30 DEATH — deceased

## 2016-03-11 IMAGING — CR DG CHEST 1V PORT
1 series · 1 of 1 positions shown · non-contrast
Comparison: DG CHEST 1V PORT dated 09/01/2013

CLINICAL DATA: picc placement

EXAM:
PORTABLE CHEST - 1 VIEW

[AP]
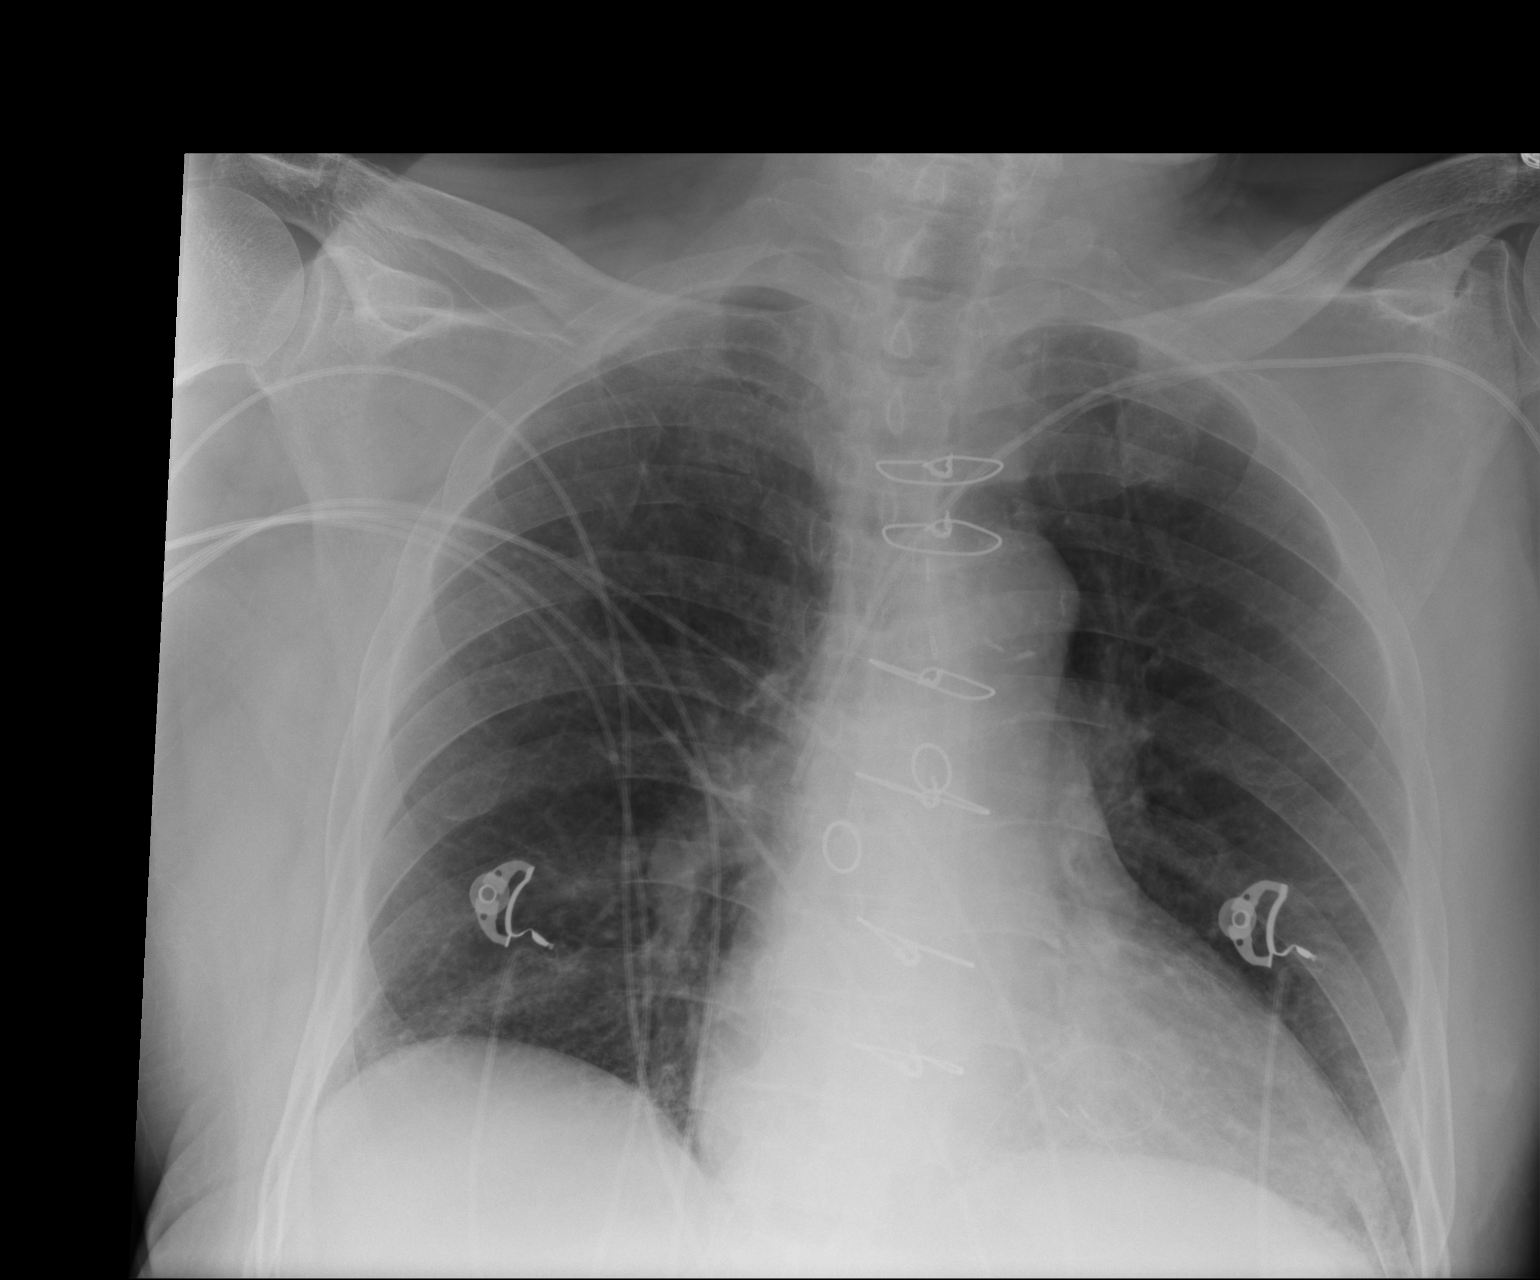

[1 of 1 positions shown; findings below may reference images not displayed]

FINDINGS: Low lung volumes. Cardiac silhouette is enlarged. Patient is status
post median sternotomy and coronary artery bypass grafting. A
left-sided PICC line via a left subclavian approach tip projecting
region superior vena cava. Lungs otherwise clear. No pneumothorax.
Osseous structures unremarkable.
IMPRESSION: Left-sided PICC line tip projected regions superior vena cava. No
evidence of acute cardiopulmonary disease.

## 2016-03-21 IMAGING — CR DG SHOULDER 2+V PORT*R*
2 series · 2 of 2 positions shown · non-contrast
Comparison: None.

CLINICAL DATA: Fall with right shoulder pain.

EXAM:
PORTABLE RIGHT SHOULDER - 2+ VIEW

[AP (1 of 2)]
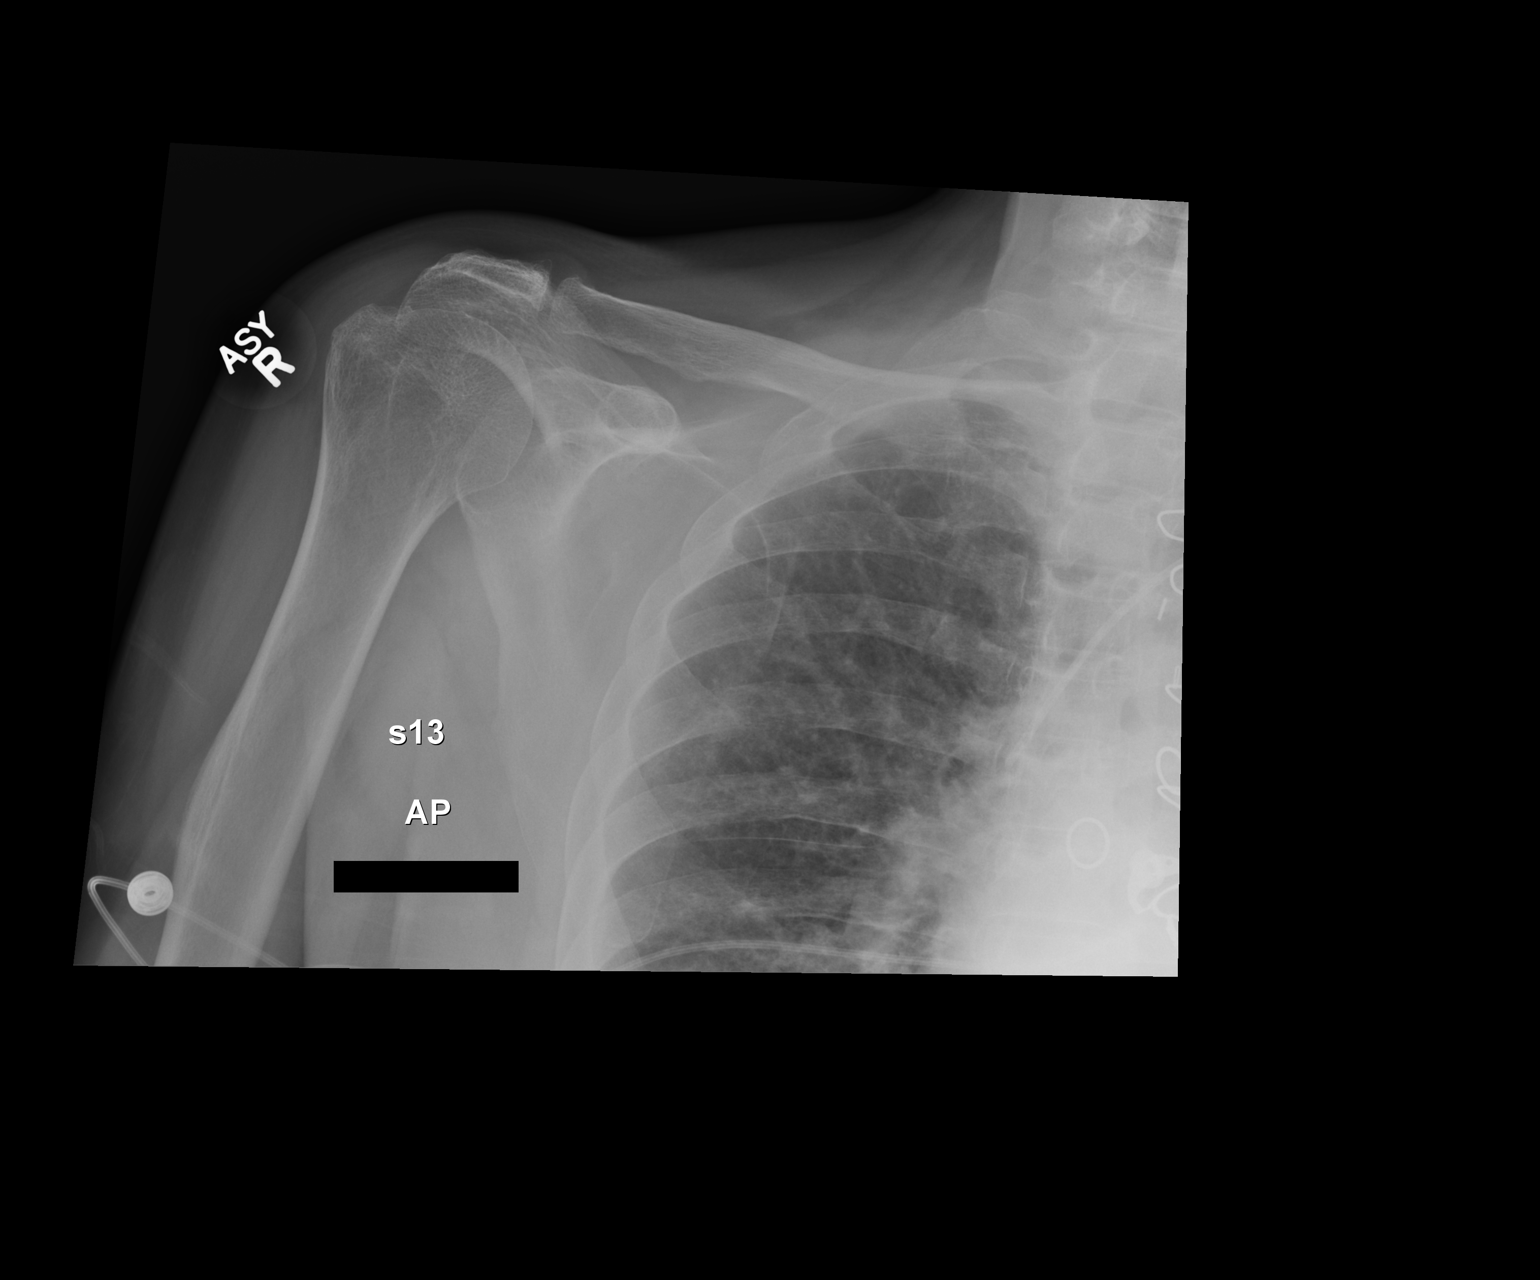

[AP (2 of 2)]
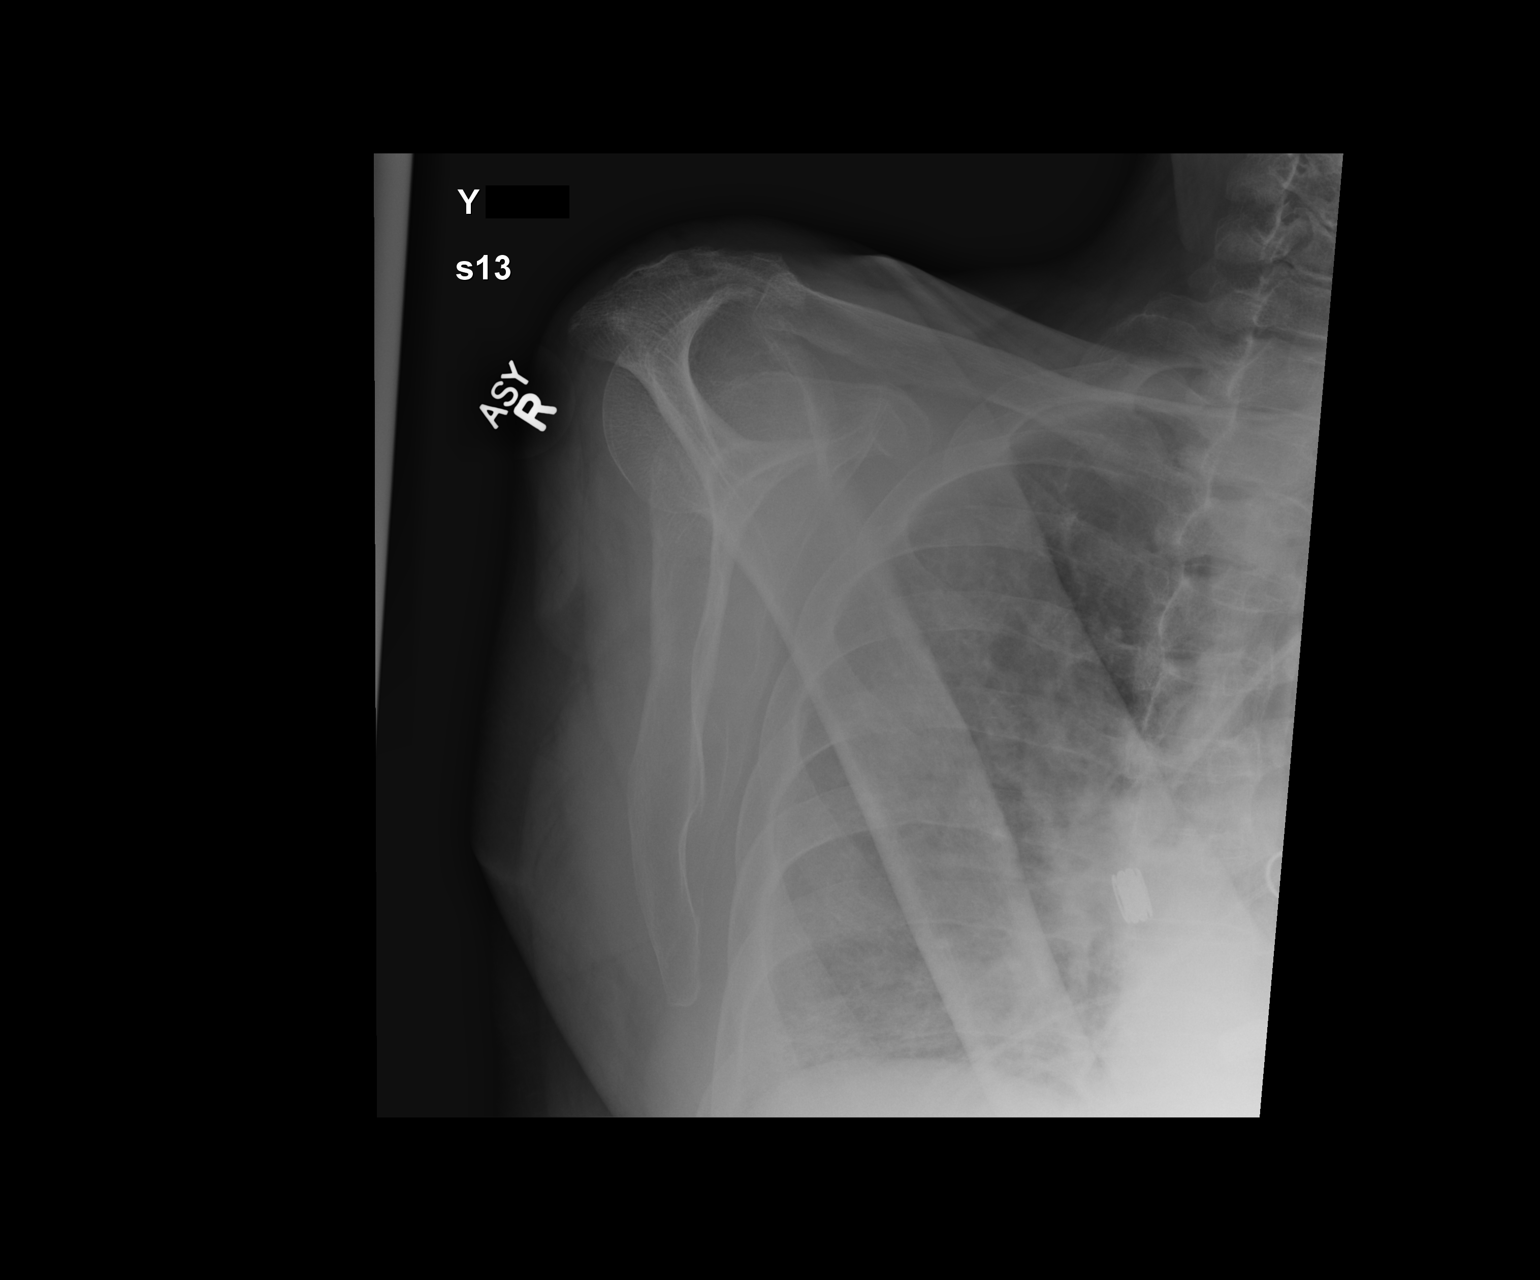

[2 of 2 positions shown; findings below may reference images not displayed]

FINDINGS: No fracture or dislocation. Minor AC joint osteoarthritis.
Glenohumeral joint is well preserved. Underlying ribs are intact.
Soft tissues are unremarkable.
IMPRESSION: No fracture or dislocation.
